# Patient Record
Sex: Female | Born: 1980 | Race: Black or African American | Hispanic: No | Marital: Single | Smoking: Never smoker
Health system: Southern US, Community
[De-identification: ages and names within clinical notes are randomized; demographics above are authoritative.]

## PROBLEM LIST (undated history)

## (undated) DIAGNOSIS — O24419 Gestational diabetes mellitus in pregnancy, unspecified control: Secondary | ICD-10-CM

## (undated) DIAGNOSIS — I2699 Other pulmonary embolism without acute cor pulmonale: Secondary | ICD-10-CM

## (undated) HISTORY — PX: ECTOPIC PREGNANCY SURGERY: SHX613

## (undated) HISTORY — PX: TUBAL LIGATION: SHX77

---

## 2011-08-06 ENCOUNTER — Emergency Department (HOSPITAL_BASED_OUTPATIENT_CLINIC_OR_DEPARTMENT_OTHER)
Admission: EM | Admit: 2011-08-06 | Discharge: 2011-08-06 | Disposition: A | Discharge status: Home / Self Care | Payer: Medicaid Other | Attending: Emergency Medicine | Admitting: Emergency Medicine

## 2011-08-06 ENCOUNTER — Encounter (HOSPITAL_BASED_OUTPATIENT_CLINIC_OR_DEPARTMENT_OTHER): Payer: Self-pay | Admitting: Emergency Medicine

## 2011-08-06 DIAGNOSIS — M549 Dorsalgia, unspecified: Secondary | ICD-10-CM | POA: Insufficient documentation

## 2011-08-06 DIAGNOSIS — R209 Unspecified disturbances of skin sensation: Secondary | ICD-10-CM | POA: Insufficient documentation

## 2011-08-06 HISTORY — DX: Other pulmonary embolism without acute cor pulmonale: I26.99

## 2011-08-06 LAB — URINALYSIS, ROUTINE W REFLEX MICROSCOPIC
Glucose, UA: NEGATIVE mg/dL
Ketones, ur: NEGATIVE mg/dL
Leukocytes, UA: NEGATIVE
Nitrite: NEGATIVE
Protein, ur: NEGATIVE mg/dL

## 2011-08-06 LAB — BASIC METABOLIC PANEL
BUN: 12 mg/dL (ref 6–23)
Chloride: 103 mEq/L (ref 96–112)
GFR calc Af Amer: >90 mL/min (ref >90)
GFR calc non Af Amer: >90 mL/min (ref >90)
Glucose, Bld: 96 mg/dL (ref 70–99)
Potassium: 4 mEq/L (ref 3.5–5.1)
Sodium: 136 mEq/L (ref 135–145)

## 2011-08-06 LAB — DIFFERENTIAL
Basophils Relative: 1 % (ref 0–1)
Eosinophils Absolute: 0.2 10*3/uL (ref 0.0–0.7)
Lymphs Abs: 2.1 10*3/uL (ref 0.7–4.0)
Monocytes Relative: 11 % (ref 3–12)
Neutro Abs: 2.5 10*3/uL (ref 1.7–7.7)
Neutrophils Relative %: 47 % (ref 43–77)

## 2011-08-06 LAB — CBC
Hemoglobin: 12.3 g/dL (ref 12.0–15.0)
MCH: 29.6 pg (ref 26.0–34.0)
Platelets: 231 10*3/uL (ref 150–400)
RBC: 4.16 MIL/uL (ref 3.87–5.11)

## 2011-08-06 LAB — PROTIME-INR: INR: 0.95 (ref 0.00–1.49)

## 2011-08-06 LAB — URINE MICROSCOPIC-ADD ON

## 2011-08-06 MED ORDER — TRAMADOL HCL 50 MG PO TABS
50.0000 mg | ORAL_TABLET | Freq: Once | ORAL | Status: AC
  Administered 2011-08-06: 50 mg via ORAL
  Filled 2011-08-06: qty 1

## 2011-08-06 MED ORDER — TRAMADOL HCL 50 MG PO TABS: 50.0000 mg | ORAL_TABLET | Freq: Four times a day (QID) | ORAL | Status: AC | PRN

## 2011-08-06 NOTE — ED Notes (Signed)
MD at bedside. 

## 2011-08-06 NOTE — ED Notes (Signed)
Pt had to be told repeatedly to stay off cell phone until triage could be completed.

## 2011-08-06 NOTE — ED Notes (Signed)
Pt reports numbness in hands, legs, feet, and all over x 1 month.

## 2011-08-06 NOTE — ED Notes (Signed)
Pt reports sudden onset of back pain between shoulder blades while standing at work. Pt denies bending over or lifting.

## 2011-08-06 NOTE — ED Notes (Signed)
Pt ambulatory to bathroom without difficulty.  

## 2011-08-06 NOTE — ED Notes (Signed)
Pt has tenderness all over back and neck, no point tenderness. Unable to fully assess ROM due to pt unwillingness to cooperate with essessment.

## 2011-08-06 NOTE — ED Notes (Signed)
Family at bedside. 

## 2011-08-06 NOTE — Discharge Instructions (Signed)
Back Pain, Adult Low back pain is very common. About 1 in 5 people have back pain.The cause of low back pain is rarely dangerous. The pain often gets better over time.About half of people with a sudden onset of back pain feel better in just 2 weeks. About 8 in 10 people feel better by 6 weeks.  CAUSES Some common causes of back pain include:  Strain of the muscles or ligaments supporting the spine.   Wear and tear (degeneration) of the spinal discs.   Arthritis.   Direct injury to the back.  DIAGNOSIS Most of the time, the direct cause of low back pain is not known.However, back pain can be treated effectively even when the exact cause of the pain is unknown.Answering your caregiver's questions about your overall health and symptoms is one of the most accurate ways to make sure the cause of your pain is not dangerous. If your caregiver needs more information, he or she may order lab work or imaging tests (X-rays or MRIs).However, even if imaging tests show changes in your back, this usually does not require surgery. HOME CARE INSTRUCTIONS For many people, back pain returns.Since low back pain is rarely dangerous, it is often a condition that people can learn to manageon their own.   Remain active. It is stressful on the back to sit or stand in one place. Do not sit, drive, or stand in one place for more than 30 minutes at a time. Take short walks on level surfaces as soon as pain allows.Try to increase the length of time you walk each day.   Do not stay in bed.Resting more than 1 or 2 days can delay your recovery.   Do not avoid exercise or work.Your body is made to move.It is not dangerous to be active, even though your back may hurt.Your back will likely heal faster if you return to being active before your pain is gone.   Pay attention to your body when you bend and lift. Many people have less discomfortwhen lifting if they bend their knees, keep the load close to their  bodies,and avoid twisting. Often, the most comfortable positions are those that put less stress on your recovering back.   Find a comfortable position to sleep. Use a firm mattress and lie on your side with your knees slightly bent. If you lie on your back, put a pillow under your knees.   Only take over-the-counter or prescription medicines as directed by your caregiver. Over-the-counter medicines to reduce pain and inflammation are often the most helpful.Your caregiver may prescribe muscle relaxant drugs.These medicines help dull your pain so you can more quickly return to your normal activities and healthy exercise.   Put ice on the injured area.   Put ice in a plastic bag.   Place a towel between your skin and the bag.   Leave the ice on for 15 to 20 minutes, 3 to 4 times a day for the first 2 to 3 days. After that, ice and heat may be alternated to reduce pain and spasms.   Ask your caregiver about trying back exercises and gentle massage. This may be of some benefit.   Avoid feeling anxious or stressed.Stress increases muscle tension and can worsen back pain.It is important to recognize when you are anxious or stressed and learn ways to manage it.Exercise is a great option.  SEEK MEDICAL CARE IF:  You have pain that is not relieved with rest or medicine.   You have   pain that does not improve in 1 week.   You have new symptoms.   You are generally not feeling well.  SEEK IMMEDIATE MEDICAL CARE IF:   You have pain that radiates from your back into your legs.   You develop new bowel or bladder control problems.   You have unusual weakness or numbness in your arms or legs.   You develop nausea or vomiting.   You develop abdominal pain.   You feel faint.  Document Released: 05/27/2005 Document Revised: 02/06/2011 Document Reviewed: 10/15/2010 [employer] Patient Information 2012 [employer].Back Exercises Back exercises help treat and prevent back injuries. The goal  of back exercises is to increase the strength of your abdominal and back muscles and the flexibility of your back. These exercises should be started when you no longer have back pain. Back exercises include:  Pelvic Tilt. Lie on your back with your knees bent. Tilt your pelvis until the lower part of your back is against the floor. Hold this position 5 to 10 sec and repeat 5 to 10 times.   Knee to Chest. Pull first 1 knee up against your chest and hold for 20 to 30 seconds, repeat this with the other knee, and then both knees. This may be done with the other leg straight or bent, whichever feels better.   Sit-Ups or Curl-Ups. Bend your knees 90 degrees. Start with tilting your pelvis, and do a partial, slow sit-up, lifting your trunk only 30 to 45 degrees off the floor. Take at least 2 to 3 seconds for each sit-up. Do not do sit-ups with your knees out straight. If partial sit-ups are difficult, simply do the above but with only tightening your abdominal muscles and holding it as directed.   Hip-Lift. Lie on your back with your knees flexed 90 degrees. Push down with your feet and shoulders as you raise your hips a couple inches off the floor; hold for 10 seconds, repeat 5 to 10 times.   Back arches. Lie on your stomach, propping yourself up on bent elbows. Slowly press on your hands, causing an arch in your low back. Repeat 3 to 5 times. Any initial stiffness and discomfort should lessen with repetition over time.   Shoulder-Lifts. Lie face down with arms beside your body. Keep hips and torso pressed to floor as you slowly lift your head and shoulders off the floor.  Do not overdo your exercises, especially in the beginning. Exercises may cause you some mild back discomfort which lasts for a few minutes; however, if the pain is more severe, or lasts for more than 15 minutes, do not continue exercises until you see your caregiver. Improvement with exercise therapy for back problems is slow.  See your  caregivers for assistance with developing a proper back exercise program. Document Released: 07/04/2004 Document Revised: 01/23/2011 Document Reviewed: 05/27/2005 [employer] Patient Information 2012 [employer]. 

## 2011-08-07 NOTE — ED Provider Notes (Signed)
History     CSN: [id number]  Arrival date & time 08/06/11  2021   First MD Initiated Contact with Patient 08/06/11 2058      Chief Complaint  Patient presents with  . Back Pain    (Consider location/radiation/quality/duration/timing/severity/associated sxs/prior treatment) HPI Patient is a 31 year old female who presents today with numerous complaints including mid thoracic back pain as well as numbness in her hands legs and feet for over one month. Patient reports that the pain in her upper back began today while she was at work. 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 There is no other significant injury reported. Patient describes the numbness she is experiencing as beginning in her feet and going all the way up her legs to her knees in a sock-like distribution. She also describes having numbness in her hands and forearms that extends from her fingers up to her elbows. This is bilateral. Patient denies any incontinence of stool or urine. Movement makes her pain worse. She's tried ibuprofen which has not made her pain better. There are no other associated or modifying factors. Past Medical History  Diagnosis Date  . PE (pulmonary embolism)     Past Surgical History  Procedure Date  . Tubal ligation     History reviewed. No pertinent family history.  History  Substance Use Topics  . Smoking status: Never Smoker   . Smokeless tobacco: Not on file  . Alcohol Use: Yes    OB History    Grav Para Term Preterm Abortions TAB SAB Ect Mult Living                  Review of Systems  Constitutional: Negative.   HENT: Negative.   Eyes: Negative.   Respiratory: Negative.   Cardiovascular: Negative.   Gastrointestinal: Negative.   Genitourinary: Negative.   Musculoskeletal: Positive for back pain.  Skin: Negative.   Neurological: Positive for numbness.  Hematological: Negative.   Psychiatric/Behavioral: Negative.   All other systems  reviewed and are negative.    Allergies  Review of patient's allergies indicates no known allergies.  Home Medications   Current Outpatient Rx  Name Route Sig Dispense Refill  . IBUPROFEN 200 MG PO TABS Oral Take 200 mg by mouth once as needed. For pain    . TRAMADOL HCL 50 MG PO TABS Oral Take 1 tablet (50 mg total) by mouth every 6 (six) hours as needed for pain. 30 tablet 0    BP 124/83  Pulse 83  Temp(Src) 98.2 F (36.8 C) (Oral)  Resp 18  SpO2 98%  LMP 08/06/2011  Physical Exam  Nursing note and vitals reviewed. GEN: Well-developed, well-nourished female in no distress HEENT: Atraumatic, normocephalic. Oropharynx clear without erythema EYES: PERRLA BL, no scleral icterus. NECK: Trachea midline, no meningismus CV: regular rate and rhythm. No murmurs, rubs, or gallops PULM: No respiratory distress.  No crackles, wheezes, or rales. GI: soft, non-tender. No guarding, rebound, or tenderness. + bowel sounds  Neuro: cranial nerves 2-12 intact, A and O x 3, patient reports numbness in a nonneurologic stocking and glove distribution. She reports weakness in her extremities but then is able to move them during the exam. MSK: Patient moves all 4 extremities symmetrically, no deformity, edema, or injury noted,  patient reports that she hurts every time I touch her   ED Course  Procedures (including critical care time)  Labs Reviewed  URINALYSIS, ROUTINE W REFLEX MICROSCOPIC - Abnormal; Notable  for the following:    Hgb urine dipstick MODERATE (*)    All other components within normal limits  URINE MICROSCOPIC-ADD ON - Abnormal; Notable for the following:    Squamous Epithelial / LPF FEW (*)    Bacteria, UA FEW (*)    All other components within normal limits  CBC  DIFFERENTIAL  BASIC METABOLIC PANEL  PROTIME-INR   No results found.   1. 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 Her exam was unremarkable. No imaging was warranted given the mechanism patient described. Additionally patient complained of numbness and weakness. She was able to ambulate independently and move all 4 extremities. Patient also had symptoms of a nonneurologic distribution. She was notified of this and reassured that she could not be having a stroke or any other concerning neurologic pathology today. Patient was given a dose of tramadol 50 mg by mouth here. She was discharged for similar. Patient also reported that she was concerned about having possible bruising and she did have CBC and coags there were negative today. Given her complain of back pain she had renal function checked there was also within normal limits as well as a urinalysis that was unremarkable. Though blood was noted on this patient was currently having her period and this was a non-cath specimen. Patient was discharged in good condition with a prescription for tramadol.        Cyndra Numbers, MD 08/07/11 [phone]

## 2012-09-01 ENCOUNTER — Emergency Department (HOSPITAL_BASED_OUTPATIENT_CLINIC_OR_DEPARTMENT_OTHER): Payer: Self-pay

## 2012-09-01 ENCOUNTER — Encounter (HOSPITAL_BASED_OUTPATIENT_CLINIC_OR_DEPARTMENT_OTHER): Payer: Self-pay | Admitting: *Deleted

## 2012-09-01 ENCOUNTER — Inpatient Hospital Stay (HOSPITAL_BASED_OUTPATIENT_CLINIC_OR_DEPARTMENT_OTHER)
Admission: EM | Admit: 2012-09-01 | Discharge: 2012-09-04 | DRG: 556 | Disposition: A | Discharge status: Home Health | Payer: PRIVATE HEALTH INSURANCE | Attending: Internal Medicine | Admitting: Internal Medicine

## 2012-09-01 DIAGNOSIS — R200 Anesthesia of skin: Secondary | ICD-10-CM

## 2012-09-01 DIAGNOSIS — R29898 Other symptoms and signs involving the musculoskeletal system: Principal | ICD-10-CM | POA: Diagnosis present

## 2012-09-01 DIAGNOSIS — R5383 Other fatigue: Secondary | ICD-10-CM

## 2012-09-01 DIAGNOSIS — R209 Unspecified disturbances of skin sensation: Secondary | ICD-10-CM | POA: Diagnosis present

## 2012-09-01 DIAGNOSIS — Z86711 Personal history of pulmonary embolism: Secondary | ICD-10-CM

## 2012-09-01 DIAGNOSIS — R531 Weakness: Secondary | ICD-10-CM

## 2012-09-01 DIAGNOSIS — F411 Generalized anxiety disorder: Secondary | ICD-10-CM | POA: Diagnosis present

## 2012-09-01 DIAGNOSIS — R202 Paresthesia of skin: Secondary | ICD-10-CM | POA: Diagnosis present

## 2012-09-01 LAB — COMPREHENSIVE METABOLIC PANEL
Albumin: 3.9 g/dL (ref 3.5–5.2)
BUN: 8 mg/dL (ref 6–23)
Calcium: 9.1 mg/dL (ref 8.4–10.5)
Chloride: 105 mEq/L (ref 96–112)
Creatinine, Ser: 0.7 mg/dL (ref 0.50–1.10)
Total Bilirubin: 0.2 mg/dL — ABNORMAL LOW (ref 0.3–1.2)

## 2012-09-01 LAB — CBC WITH DIFFERENTIAL/PLATELET
Basophils Absolute: 0 10*3/uL (ref 0.0–0.1)
Basophils Relative: 1 % (ref 0–1)
Eosinophils Absolute: 0.2 10*3/uL (ref 0.0–0.7)
Eosinophils Relative: 5 % (ref 0–5)
HCT: 35 % — ABNORMAL LOW (ref 36.0–46.0)
Hemoglobin: 11.9 g/dL — ABNORMAL LOW (ref 12.0–15.0)
MCH: 30.7 pg (ref 26.0–34.0)
MCHC: 34 g/dL (ref 30.0–36.0)
MCV: 90.2 fL (ref 78.0–100.0)
Monocytes Absolute: 0.5 10*3/uL (ref 0.1–1.0)
Monocytes Relative: 12 % (ref 3–12)
Neutro Abs: 1.6 10*3/uL — ABNORMAL LOW (ref 1.7–7.7)
RDW: 12.1 % (ref 11.5–15.5)

## 2012-09-01 LAB — PREGNANCY, URINE: Preg Test, Ur: NEGATIVE

## 2012-09-01 MED ORDER — ONDANSETRON HCL 4 MG/2ML IJ SOLN: 4.0000 mg | Freq: Four times a day (QID) | INTRAMUSCULAR | Status: DC | PRN

## 2012-09-01 MED ORDER — HYDROCODONE-ACETAMINOPHEN 5-325 MG PO TABS
1.0000 | ORAL_TABLET | ORAL | Status: DC | PRN
  Administered 2012-09-01 – 2012-09-03 (×3): 2 via ORAL
  Filled 2012-09-01 (×2): qty 2

## 2012-09-01 MED ORDER — ONDANSETRON HCL 4 MG PO TABS: 4.0000 mg | ORAL_TABLET | Freq: Four times a day (QID) | ORAL | Status: DC | PRN

## 2012-09-01 MED ORDER — SODIUM CHLORIDE 0.9 % IV SOLN: 250.0000 mL | INTRAVENOUS | Status: DC | PRN

## 2012-09-01 MED ORDER — SODIUM CHLORIDE 0.9 % IJ SOLN
3.0000 mL | Freq: Two times a day (BID) | INTRAMUSCULAR | Status: DC
  Administered 2012-09-02: 3 mL via INTRAVENOUS

## 2012-09-01 MED ORDER — SODIUM CHLORIDE 0.9 % IJ SOLN
3.0000 mL | Freq: Two times a day (BID) | INTRAMUSCULAR | Status: DC
  Administered 2012-09-02 – 2012-09-04 (×6): 3 mL via INTRAVENOUS

## 2012-09-01 MED ORDER — SODIUM CHLORIDE 0.9 % IJ SOLN: 3.0000 mL | INTRAMUSCULAR | Status: DC | PRN

## 2012-09-01 MED ORDER — ENOXAPARIN SODIUM 40 MG/0.4ML ~~LOC~~ SOLN
40.0000 mg | SUBCUTANEOUS | Status: DC
  Administered 2012-09-01 – 2012-09-03 (×3): 40 mg via SUBCUTANEOUS
  Filled 2012-09-01 (×4): qty 0.4

## 2012-09-01 NOTE — ED Notes (Signed)
[facility] here for transport.  

## 2012-09-01 NOTE — Consult Note (Signed)
NEURO HOSPITALIST CONSULT NOTE    Reason for Consult: Intermittent weakness and numbness of left upper extremity and both lower extremities.  HPI:                                                                                                                                          Joy Wright is an 32 y.o. female history of pulmonary embolism, presenting with complaint of intermittent weakness of both lower extremities and left upper extremity along with numbness involving all 4 extremities over the past several months. She had a similar spell of symptoms between January and March of 2013. Numbness and tingling typically starts in the hands and feet. Symptoms progressed and weakness involving left upper extremity and left lower extremity primarily and to a lesser extent right lower extremity. There is no clear history of urinary urgency. She has not had urinary incontinence. She's had no symptoms involving speech and no facial weakness or numbness. She'll CT scan of her head today without contrast which was normal. She's had no injury to her neck.  Past Medical History  Diagnosis Date  . PE (pulmonary embolism)     Past Surgical History  Procedure Laterality Date  . Tubal ligation      History reviewed. No pertinent family history.   Social History:  reports that she has never smoked. She does not have any smokeless tobacco history on file. She reports that  drinks alcohol. She reports that she does not use illicit drugs.  No Known Allergies  MEDICATIONS:                                                                                                                     I have reviewed the patient's current medications. Prior to Admission:  Prescriptions prior to admission  Medication Sig Dispense Refill  . ibuprofen (ADVIL,MOTRIN) 200 MG tablet Take 200 mg by mouth once as needed. For pain         Blood pressure 134/83, pulse 67, temperature 98 F (36.7  C), temperature source Oral, resp. rate 16, height 5\' 2"  (1.575 m), weight 57.5 kg (126 lb 12.2 oz), last menstrual period 08/23/2012, SpO2 100.00%.   Neurologic Examination:  Mental Status: Alert, oriented, thought content appropriate.  Speech fluent without evidence of aphasia. Able to follow commands without difficulty. Cranial Nerves: II-Visual fields were normal. III/IV/VI-Pupils were equal and reacted. Extraocular movements were full and conjugate.    V/VII-slight reduction in tactile sensation over the left side of face compared to the right; no facial weakness. VIII-normal. X-normal speech and symmetrical palatal movement. Motor: Equivocal mild weakness proximally and distally of left upper and lower extremities. Effort was variable. Strength of right extremities was normal. Muscle tone was normal throughout. Sensory: Reduced perception of tactile sensation over left extremities compared to right extremities Deep Tendon Reflexes: Asymmetric with 2+ left lower extremity reflexes compared to 1+ on the right; 1+ and symmetrical extremities on the left.. Plantars: Flexor bilaterally Cerebellar: Normal finger-to-nose testing on the right; poor effort with testing of left upper extremity. Carotid auscultation: Normal  No results found for this basename: cbc, bmp, coags, chol, tri, ldl, hga1c    Results for orders placed during the hospital encounter of 09/01/12 (from the past 48 hour(s))  CBC WITH DIFFERENTIAL     Status: Abnormal   Collection Time    09/01/12  3:50 PM      Result Value Range   WBC 4.3  4.0 - 10.5 K/uL   RBC 3.88  3.87 - 5.11 MIL/uL   Hemoglobin 11.9 (*) 12.0 - 15.0 g/dL   HCT 16.1 (*) 09.6 - 04.5 %   MCV 90.2  78.0 - 100.0 fL   MCH 30.7  26.0 - 34.0 pg   MCHC 34.0  30.0 - 36.0 g/dL   RDW 40.9  81.1 - 91.4 %   Platelets 188  150 - 400 K/uL   Neutrophils Relative  37 (*) 43 - 77 %   Neutro Abs 1.6 (*) 1.7 - 7.7 K/uL   Lymphocytes Relative 45  12 - 46 %   Lymphs Abs 2.0  0.7 - 4.0 K/uL   Monocytes Relative 12  3 - 12 %   Monocytes Absolute 0.5  0.1 - 1.0 K/uL   Eosinophils Relative 5  0 - 5 %   Eosinophils Absolute 0.2  0.0 - 0.7 K/uL   Basophils Relative 1  0 - 1 %   Basophils Absolute 0.0  0.0 - 0.1 K/uL  COMPREHENSIVE METABOLIC PANEL     Status: Abnormal   Collection Time    09/01/12  3:50 PM      Result Value Range   Sodium 137  135 - 145 mEq/L   Potassium 4.0  3.5 - 5.1 mEq/L   Chloride 105  96 - 112 mEq/L   CO2 24  19 - 32 mEq/L   Glucose, Bld 91  70 - 99 mg/dL   BUN 8  6 - 23 mg/dL   Creatinine, Ser 7.82  0.50 - 1.10 mg/dL   Calcium 9.1  8.4 - 95.6 mg/dL   Total Protein 7.5  6.0 - 8.3 g/dL   Albumin 3.9  3.5 - 5.2 g/dL   AST 15  0 - 37 U/L   ALT 9  0 - 35 U/L   Alkaline Phosphatase 43  39 - 117 U/L   Total Bilirubin 0.2 (*) 0.3 - 1.2 mg/dL   GFR calc non Af Amer >90  >90 mL/min   GFR calc Af Amer >90  >90 mL/min   Comment:            The eGFR has been calculated     using the CKD EPI  equation.     This calculation has not been     validated in all clinical     situations.     eGFR's persistently     <90 mL/min signify     possible Chronic Kidney Disease.  PREGNANCY, URINE     Status: None   Collection Time    09/01/12  4:09 PM      Result Value Range   Preg Test, Ur NEGATIVE  NEGATIVE   Comment:            THE SENSITIVITY OF THIS     METHODOLOGY IS >20 mIU/mL.    Ct Head Wo Contrast  09/01/2012  *RADIOLOGY REPORT*  Clinical Data: Chronic left-sided weakness  CT HEAD WITHOUT CONTRAST  Technique:  Contiguous axial images were obtained from the base of the skull through the vertex without contrast.  Comparison: None.  Findings: No evidence of parenchymal hemorrhage or extra-axial fluid collection. No mass lesion, mass effect, or midline shift.  No CT evidence of acute infarction.  Cerebral volume is age appropriate.  No  ventriculomegaly.  The visualized paranasal sinuses are essentially clear. The mastoid air cells are unopacified.  No evidence of calvarial fracture.  IMPRESSION: Normal head CT.   Original Report Authenticated By: Charline Bills, M.D.    Assessment/Plan: Etiology for presenting motor and sensory symptoms is unclear. Patient has no clear indications clinically of encephalopathic or myelopathic process. Demyelinating disease is a consideration cannot be ruled out, however. It appeared to be significant psychophysiologic contributions to her symptomatology.  Recommendations: 1. MRI of the brain without and with contrast to rule out possible demyelinating disorder. 2. MRI of cervical spine without and with contrast as well to rule out possible demyelinating lesion as well as possible cord compression. 3. Physical therapy consult. 4. Neurology to continue following this patient with you.  Venetia Maxon M.D. [facility] [phone]  09/01/2012, 8:10 PM

## 2012-09-01 NOTE — ED Provider Notes (Addendum)
History     CSN: [id number]  Arrival date & time 09/01/12  1506   First MD Initiated Contact with Patient 09/01/12 1519      Chief Complaint  Patient presents with  . Leg Pain  . Arm Pain  . Headache    (Consider location/radiation/quality/duration/timing/severity/associated sxs/prior treatment) Patient is a 32 y.o. female presenting with leg pain, arm pain, and headaches. The history is provided by the patient.  Leg Pain Arm Pain Associated symptoms include headaches.  Headache pt c/o intermittent numbness and weakness x 2 months worse x 2 week--sx localized to bilateral le and right arm--nothing makes the sx better or worse, no associated headache or visual changes--no fever or neck pain--no medications taken--pt also notes pain with these sx and was seen in ED for same and rx ultram  Past Medical History  Diagnosis Date  . PE (pulmonary embolism)     Past Surgical History  Procedure Laterality Date  . Tubal ligation      History reviewed. No pertinent family history.  History  Substance Use Topics  . Smoking status: Never Smoker   . Smokeless tobacco: Not on file  . Alcohol Use: Yes    OB History   Grav Para Term Preterm Abortions TAB SAB Ect Mult Living                  Review of Systems  Neurological: Positive for headaches.  All other systems reviewed and are negative.    Allergies  Review of patient's allergies indicates no known allergies.  Home Medications   Current Outpatient Rx  Name  Route  Sig  Dispense  Refill  . ibuprofen (ADVIL,MOTRIN) 200 MG tablet   Oral   Take 200 mg by mouth once as needed. For pain           BP 127/88  Pulse 74  Temp(Src) 98.6 F (37 C) (Oral)  Resp 20  Ht 5\' 2"  (1.575 m)  Wt 140 lb (63.504 kg)  BMI 25.6 kg/m2  SpO2 100%  LMP 08/23/2012  Physical Exam  Nursing note and vitals reviewed. Constitutional: She is oriented to person, place, and time. She appears well-developed and well-nourished.   Non-toxic appearance. No distress.  HENT:  Head: Normocephalic and atraumatic.  Eyes: Conjunctivae, EOM and lids are normal. Pupils are equal, round, and reactive to light.  Neck: Normal range of motion. Neck supple. No tracheal deviation present. No mass present.  Cardiovascular: Normal rate, regular rhythm and normal heart sounds.  Exam reveals no gallop.   No murmur heard. Pulmonary/Chest: Effort normal and breath sounds normal. No stridor. No respiratory distress. She has no decreased breath sounds. She has no wheezes. She has no rhonchi. She has no rales.  Abdominal: Soft. Normal appearance and bowel sounds are normal. She exhibits no distension. There is no tenderness. There is no rebound and no CVA tenderness.  Musculoskeletal: Normal range of motion. She exhibits no edema and no tenderness.  Neurological: She is alert and oriented to person, place, and time. She displays abnormal reflex. She displays no atrophy and no tremor. A sensory deficit is present. No cranial nerve deficit. GCS eye subscore is 4. GCS verbal subscore is 5. GCS motor subscore is 6.  Reflex Scores:      Patellar reflexes are 1+ on the left side. Left upper and left lower strength ext strength 3/5, no facial droop--subjective numbness bilateral le  Skin: Skin is warm and dry. No abrasion and no rash noted.  Psychiatric: Her speech is normal and behavior is normal. Her affect is blunt.    ED Course  Procedures (including critical care time)  Labs Reviewed  CBC WITH DIFFERENTIAL  COMPREHENSIVE METABOLIC PANEL  PREGNANCY, URINE   No results found.   No diagnosis found.    MDM  Spoke with neurology about patient's current symptoms. Patient will need to have an MRI of her brain and C-spine as well as for the work. They recommended the patient be transferred to [facility] and admitted to the hospitalist service for consultation neurology. He'll contact tried to arrange this. Patient symptoms did not  meet the code stroke criteria because she they have been waxing and waning for over 2 weeks and the patient states that she is having lots of pain which is inhibiting her from moving her left arm and left leg normally. She does endorse numbness but states that the pain is worse preventing her from cooperating with my exam   Date: 09/01/2012  Rate: 76  Rhythm: normal sinus rhythm  QRS Axis: normal  Intervals: normal  ST/T Wave abnormalities: normal  Conduction Disutrbances:none  Narrative Interpretation:   Old EKG Reviewed: none available        Toy Baker, MD 09/01/12 1723  Toy Baker, MD 09/01/12 [phone]

## 2012-09-01 NOTE — Progress Notes (Signed)
32 year old female-complaining of pain/numbness/weakness the left side of the body. CT head negative. ED M.D. consulted with neurology who asked [place] to admit and they will consult. They wanted a MRI of the brain and MRI of the C-spine. He is formally consult neurology when the patient gets to [facility].

## 2012-09-01 NOTE — ED Notes (Signed)
Called neurologoist for Dr. Freida Busman via [facility]

## 2012-09-01 NOTE — ED Notes (Signed)
MD at bedside. 

## 2012-09-01 NOTE — ED Notes (Signed)
Patient transported to CT 

## 2012-09-01 NOTE — ED Notes (Signed)
Pt to triage in w/c reporting left arm and leg pains and ha off and on since January, worse today while at work so she called ems.

## 2012-09-01 NOTE — H&P (Signed)
History and Physical  Joy Wright ZOX:[id number] DOB: [birth year] DOA: 09/01/2012  Referring physician: Lorre Nick MD PCP: No primary provider on file.   Chief Complaint: left arm weakness and numbness  HPI: Patient is a 32 year old female with past medical history of pulmonary embolism presented to [facility] with chief complaints of inability to walk due to left sided weakness and paralysis. Patient has been having episodes of numbness and weakness in her extremities since January 2013. She had a similar spell of symptoms between January and March of 2013. Numbness and tingling typically starts in the hands and feet. Symptoms progressed and weakness involving left upper extremity and left lower extremity primarily and to a lesser extent right lower extremity. There is no clear history of urinary urgency. She has not had urinary incontinence. She's had no symptoms involving speech and no facial weakness or numbness. Patient denies any fever, chills or any recent viral illnesses. No changes in bowel movements have been noted. Patient has not had any recent changes in her medications. CT scan of her head today without contrast which was normal. She's had no injury to her neck.  Patient was admitted to [facility] to get further evaluation by neurology.   Review of Systems:  15 point review of systems is negative otherwise.  Past Medical History  Diagnosis Date  . PE (pulmonary embolism)     Past Surgical History  Procedure Laterality Date  . Tubal ligation      Social History:  reports that she has never smoked. She does not have any smokeless tobacco history on file. She reports that  drinks alcohol. She reports that she does not use illicit drugs.  No Known Allergies  History reviewed. No pertinent family history.   Prior to Admission medications   Medication Sig Start Date End Date Taking? Authorizing Provider  ibuprofen (ADVIL,MOTRIN) 200 MG tablet Take 200  mg by mouth once as needed. For pain    Historical Provider, MD   Physical Exam: Filed Vitals:   09/01/12 1520 09/01/12 1659 09/01/12 1809 09/01/12 1916  BP: 127/88 125/91 121/78 134/83  Pulse: 74 72 69 67  Temp: 98.6 F (37 C)   98 F (36.7 C)  TempSrc: Oral   Oral  Resp: 20 16 20 16   Height: 5\' 2"  (1.575 m)   5\' 2"  (1.575 m)  Weight: 140 lb (63.504 kg)   126 lb 12.2 oz (57.5 kg)  SpO2: 100% 100% 100% 100%   Physical Exam: General: Vital signs reviewed and noted. Well-developed, well-nourished, in no acute distress; alert, appropriate and cooperative throughout examination.  Head: Normocephalic, atraumatic.  Eyes: PERRL, EOMI, No signs of anemia or jaundince.  Nose: Mucous membranes moist, not inflammed, nonerythematous.  Throat: Oropharynx nonerythematous, no exudate appreciated.   Neck: No deformities, masses, or tenderness noted.Supple, No carotid Bruits, no JVD.  Lungs:  Normal respiratory effort. Clear to auscultation BL without crackles or wheezes.  Heart: RRR. S1 and S2 normal without gallop, murmur, or rubs.  Abdomen:  BS normoactive. Soft, Nondistended, non-tender.  No masses or organomegaly.  Extremities: No pretibial edema.  Neurologic:  patient is alert and oriented x3, left upper extremity and left lower extremity strength is 0/5, right upper extremity and right lower extremity is 5 out of 5, cranial nerves 2-12 intact, sensations are absent the left upper and lower extremities but normal in the right upper and lower extremities, Reflexes are asymmetrical and 1+ in the right upper and lower extremity  but absent on the left upper and left lower extremity  Gait could not be assessed  Unable to perform cerebellar testing on the left side   Skin: No visible rashes, scars.     Wt Readings from Last 3 Encounters:  09/01/12 126 lb 12.2 oz (57.5 kg)    Labs on Admission:  Basic Metabolic Panel:  Recent Labs Lab 09/01/12 1550  NA 137  K 4.0  CL 105  CO2 24   GLUCOSE 91  BUN 8  CREATININE 0.70  CALCIUM 9.1    Liver Function Tests:  Recent Labs Lab 09/01/12 1550  AST 15  ALT 9  ALKPHOS 43  BILITOT 0.2*  PROT 7.5  ALBUMIN 3.9   CBC:  Recent Labs Lab 09/01/12 1550  WBC 4.3  NEUTROABS 1.6*  HGB 11.9*  HCT 35.0*  MCV 90.2  PLT 188     Radiological Exams on Admission: Ct Head Wo Contrast  09/01/2012  *RADIOLOGY REPORT*  Clinical Data: Chronic left-sided weakness  CT HEAD WITHOUT CONTRAST  Technique:  Contiguous axial images were obtained from the base of the skull through the vertex without contrast.  Comparison: None.  Findings: No evidence of parenchymal hemorrhage or extra-axial fluid collection. No mass lesion, mass effect, or midline shift.  No CT evidence of acute infarction.  Cerebral volume is age appropriate.  No ventriculomegaly.  The visualized paranasal sinuses are essentially clear. The mastoid air cells are unopacified.  No evidence of calvarial fracture.  IMPRESSION: Normal head CT.   Original Report Authenticated By: Charline Bills, M.D.      Principal Problem:   Left-sided weakness   Assessment/Plan  32 year old female with past medical history of pulmonary embolism currently not on any treatment comes in today with left sided weakness and inability to walk. Most likely differential at this time include demyelinating disorder like multiple myeloma given her age group. Neurology has already been consulted and they're recommending MRI of her head and spine. Patient's physical exam and history are not very consistent and it appears that patient is not participating in the exam to the max of her inability almost 12 point that it seems that she is malingering.  -Obtain MRI of brain and cervical spine -Consult physical therapy -Consult psychiatry if MRI is negative   Code Status: Full code Family Communication: None present at bedside Disposition Plan/Anticipated LOS: Guarded  Time spent: 75 minutes  Lars Mage, MD  [facility] Team 5  If 7PM-7AM, please contact night-coverage at [email or web address], password [facility] 09/01/2012, 7:24 PM

## 2012-09-02 ENCOUNTER — Inpatient Hospital Stay (HOSPITAL_COMMUNITY): Payer: Self-pay

## 2012-09-02 DIAGNOSIS — R29898 Other symptoms and signs involving the musculoskeletal system: Principal | ICD-10-CM

## 2012-09-02 MED ORDER — LORAZEPAM 2 MG/ML IJ SOLN
0.5000 mg | INTRAMUSCULAR | Status: DC | PRN
Start: 2012-09-02 — End: 2012-09-04
  Administered 2012-09-02: 0.5 mg via INTRAVENOUS

## 2012-09-02 MED ORDER — GADOBENATE DIMEGLUMINE 529 MG/ML IV SOLN
12.0000 mL | Freq: Once | INTRAVENOUS | Status: AC | PRN
  Administered 2012-09-02: 12 mL via INTRAVENOUS

## 2012-09-02 MED ORDER — LORAZEPAM 2 MG/ML IJ SOLN
INTRAMUSCULAR | Status: AC
  Filled 2012-09-02: qty 1

## 2012-09-02 MED ORDER — GADOBENATE DIMEGLUMINE 529 MG/ML IV SOLN: 12.0000 mL | Freq: Once | INTRAVENOUS | Status: DC | PRN

## 2012-09-02 NOTE — Progress Notes (Addendum)
NEURO HOSPITALIST PROGRESS NOTE   SUBJECTIVE:                                                                                                                        Still C/O weakness in left UE and LE but no paresthesia at this time.   OBJECTIVE:                                                                                                                           Vital signs in last 24 hours: Temp:  [97.7 F (36.5 C)-98.6 F (37 C)] 97.7 F (36.5 C) (03/26 0532) Pulse Rate:  [67-74] 68 (03/26 0532) Resp:  [16-20] 18 (03/26 0532) BP: (103-134)/(49-91) 103/49 mmHg (03/26 0532) SpO2:  [100 %] 100 % (03/26 0532) Weight:  [57.5 kg (126 lb 12.2 oz)-63.504 kg (140 lb)] 57.5 kg (126 lb 12.2 oz) (03/25 1916)  Intake/Output from previous day: 03/25 0701 - 03/26 0700 In: -  Out: 1 [Urine:1] Intake/Output this shift:   Nutritional status: General  Past Medical History  Diagnosis Date  . PE (pulmonary embolism)      Neurologic Exam:  Mental Status: Alert, oriented, thought content appropriate.  Speech fluent without evidence of aphasia.  Able to follow 3 step commands without difficulty. Cranial Nerves: II: Discs flat bilaterally; Visual fields grossly normal, pupils equal, round, reactive to light and accommodation III,IV, VI: ptosis not present, extra-ocular motions intact bilaterally V,VII: smile symmetric, facial light touch sensation normal bilaterally VIII: hearing normal bilaterally IX,X: gag reflex present XI: bilateral shoulder shrug XII: midline tongue extension Motor: Right : Upper extremity   5/5    Left:     Upper extremity   note  Lower extremity   5/5     Lower extremity   Note --patient showed good grip with left hand and significant give way strength with left bicep and triceps resistance.  --patient showed no effort in moving left leg and positive Hoover's.  Tone and bulk:normal tone throughout; no atrophy  noted Sensory: Pinprick and light touch intact throughout, bilaterally Deep Tendon Reflexes: 2+ and symmetric throughout Plantars: Right: downgoing   Left: downgoing Cerebellar: normal finger-to-nose on right,  normal heel-to-shin test on left CV: pulses palpable  throughout    Lab Results: No results found for this basename: cbc, bmp, coags, chol, tri, ldl, hga1c   Lipid Panel No results found for this basename: CHOL, TRIG, HDL, CHOLHDL, VLDL, LDLCALC,  in the last 72 hours  Studies/Results: Ct Head Wo Contrast  09/01/2012  *RADIOLOGY REPORT*  Clinical Data: Chronic left-sided weakness  CT HEAD WITHOUT CONTRAST  Technique:  Contiguous axial images were obtained from the base of the skull through the vertex without contrast.  Comparison: None.  Findings: No evidence of parenchymal hemorrhage or extra-axial fluid collection. No mass lesion, mass effect, or midline shift.  No CT evidence of acute infarction.  Cerebral volume is age appropriate.  No ventriculomegaly.  The visualized paranasal sinuses are essentially clear. The mastoid air cells are unopacified.  No evidence of calvarial fracture.  IMPRESSION: Normal head CT.   Original Report Authenticated By: Charline Bills, M.D.      Mr Cervical Spine Wo Contrast  09/02/2012  *RADIOLOGY REPORT*  Clinical Data:  32 year old female with history of pulmonary embolism.  Left side weakness, paralysis.  Episodes of numbness and weakness since January.  No known injury.  The patient declined the postcontrast portion of the exams as had been initially planned.  MRI HEAD WITHOUT CONTRAST MRI CERVICAL SPINE WITHOUT CONTRAST  Technique:  Multiplanar, multiecho pulse sequences of the brain and surrounding structures, and cervical spine, to include the craniocervical junction and cervicothoracic junction, were obtained without intravenous contrast.  Comparison:  Head CT without contrast 09/01/2012.  MRI HEAD  Findings:  Moderate wrap artifact affecting  diffusion weighted imaging.  Artifact in the medial right thalamus.  No definite No restricted diffusion to suggest acute infarction.  Normal cerebral volume. Major intracranial vascular flow voids are preserved. No midline shift, ventriculomegaly, mass effect, evidence of mass lesion, extra-axial collection or acute intracranial hemorrhage.  Cervicomedullary junction and pituitary are within normal limits.  Wallace Cullens and white matter signal is within normal limits throughout the brain.  Normal bone marrow signal.  Visualized orbit soft tissues are within normal limits.  Minor paranasal sinus mucosal thickening.  Small nasopharyngeal retention cyst.  Mastoids are clear.  Grossly normal visualized internal auditory structures.  Negative scalp soft tissues.  IMPRESSION: 1. Normal noncontrast MRI appearance of the brain. 2.  Cervical spine findings are below.  MRI CERVICAL SPINE  Findings: Straightening of cervical lordosis. No marrow edema or evidence of acute osseous abnormality.  4-5 mm T2 hyperintense right thyroid nodule is too small to characterize and most likely benign in the absence of known risk factors for thyroid carcinoma.  Other visible thyroid parenchyma is within normal limits.  Other visualized paraspinal soft tissues are within normal limits.  C2-C3:  Negative.  C3-C4:  Negative.  C4-C5:  Negative.  C5-C6:  Negative.  C6-C7:  Negative.  C7-T1:  Negative. Negative visualized upper thoracic levels.  Spinal cord signal is within normal limits at all visualized levels.  IMPRESSION: Normal MRI appearance of the cervical spine.   Original Report Authenticated By: Erskine Speed, M.D.     MEDICATIONS  Scheduled: . enoxaparin (LOVENOX) injection  40 mg Subcutaneous Q24H  . sodium chloride  3 mL Intravenous Q12H  . sodium chloride  3 mL Intravenous Q12H    ASSESSMENT/PLAN:                                                                                                              Etiology still unclear as MRI brain and C-spine (noncontrast) are normal but patient will need MRI brain and C-spine with contrast to fully R/O MS. Exam does show effort dependent strength on left UE and positive Hoover's on LE. Patient admits to being under significant stress at home. It appeared to be significant psychophysiologic contributions to her symptomatology. PT will be seeing patient today.   Plan : 1) MRI brain and C-spine with contrast 2) PT/OT       Assessment and plan discussed with with attending physician and they are in agreement.    Felicie Morn PA-C [facility] [phone]  09/02/2012, 1:15 PM   Addendum: MRI brain and cervical spine with and without contrast are unremarkable. No evidence of structural brain or spinal cord involvement on neuro-exam, which is mainly a functional exam. We will sign off.  Wyatt Portela, MD [facility].

## 2012-09-02 NOTE — Progress Notes (Signed)
Physical Therapy Evaluation Patient Details Name: Joy Wright MRN: [id number] DOB: [birth year] Today's Date: 09/02/2012 Time: 4098-1191 PT Time Calculation (min): 27 min  PT Assessment / Plan / Recommendation Clinical Impression  Pt is 32 yo female with left > right weakness. Though her left side is profoundly weak, her right side is not full strength either, grossly 3+/5 throughout. Pt experiencing pain at the left hip as well as the distal left leg and wround the patella. Pt was very motivated throughout eval to participate and strongly desires to get home to her children. However, at this time, she needs +2 asisst to transfer bed to chair and was unable to ambulate. Left knee buckles in standing. Highly recommend CIR consult. PT will follow to increase strength and mobilty to increase independence and safety.    PT Assessment  Patient needs continued PT services    Follow Up Recommendations  CIR;Supervision/Assistance - 24 hour    Does the patient have the potential to tolerate intense rehabilitation      Barriers to Discharge Decreased caregiver support pt has good family support but is currently needing +2 assist    Equipment Recommendations  Other (comment) (TBD)    Recommendations for Other Services Rehab consult;OT consult   Frequency Min 4X/week    Precautions / Restrictions Precautions Precautions: Fall Restrictions Weight Bearing Restrictions: No   Pertinent Vitals/Pain VSS      Mobility  Bed Mobility Bed Mobility: Rolling Left;Left Sidelying to Sit;Sitting - Scoot to Edge of Bed Rolling Left: 3: Mod assist Left Sidelying to Sit: 3: Mod assist Sitting - Scoot to Edge of Bed: 4: Min assist Details for Bed Mobility Assistance: pt able to use right arm to pull self to rail, left LE dependent for off bed, pt with pain in left hip with left SL Transfers Transfers: Sit to Stand;Stand to Sit;Stand Pivot Transfers Sit to Stand: 1: +2 Total assist;From bed;From  chair/3-in-1;With upper extremity assist Sit to Stand: Patient Percentage: 50% Stand to Sit: 1: +2 Total assist;To chair/3-in-1;With upper extremity assist Stand to Sit: Patient Percentage: 60% Stand Pivot Transfers: 1: +2 Total assist Stand Pivot Transfers: Patient Percentage: 60% Details for Transfer Assistance: left foot and knee blocked during transfer, left knee buckling slightly and difficulty stepping right foot due to inablilty to wt shift to the left.  Ambulation/Gait Ambulation/Gait Assistance: Not tested (comment) Stairs: No Wheelchair Mobility Wheelchair Mobility: No Modified Rankin (Stroke Patients Only) Pre-Morbid Rankin Score: No significant disability Modified Rankin: Severe disability    Exercises     PT Diagnosis: Difficulty walking;Hemiplegia non-dominant side;Acute pain;Generalized weakness  PT Problem List: Decreased strength;Decreased range of motion;Decreased activity tolerance;Decreased balance;Decreased mobility;Decreased coordination;Decreased knowledge of use of DME;Decreased knowledge of precautions;Pain PT Treatment Interventions: DME instruction;Gait training;Functional mobility training;Therapeutic activities;Therapeutic exercise;Balance training;Neuromuscular re-education;Patient/family education   PT Goals Acute Rehab PT Goals PT Goal Formulation: With patient Time For Goal Achievement: 09/16/12 Potential to Achieve Goals: Good Pt will go Supine/Side to Sit: with min assist PT Goal: Supine/Side to Sit - Progress: Goal set today Pt will go Sit to Supine/Side: with min assist PT Goal: Sit to Supine/Side - Progress: Goal set today Pt will go Sit to Stand: with min assist PT Goal: Sit to Stand - Progress: Goal set today Pt will go Stand to Sit: with min assist PT Goal: Stand to Sit - Progress: Goal set today Pt will Transfer Bed to Chair/Chair to Bed: with min assist PT Transfer Goal: Bed to Chair/Chair to Bed - Progress: Goal set  today Pt will  Ambulate: with +2 total assist;with least restrictive assistive device;1 - 15 feet;Other (comment) (pt >60%) PT Goal: Ambulate - Progress: Goal set today  Visit Information  Last PT Received On: 09/02/12 Assistance Needed: +2    Subjective Data  Subjective: I have to get back home to my kids, I cried all night last night being away from them Patient Stated Goal: return to home and work   Prior Functioning  Home Living Lives With: Family Available Help at Discharge: Family;Available 24 hours/day Type of Home: House Home Access: Stairs to enter [employer] of Steps: 4 Entrance Stairs-Rails: Right Home Layout: One level Home Adaptive Equipment: None Additional Comments: pt seems to have good family support, states that her mother and brother and sister can help her Prior Function Level of Independence: Independent Able to Take Stairs?: Yes Driving: Yes Vocation: Full time employment Comments: 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, paperwork, etc Communication Communication: No difficulties    Copywriter, advertising Overall Cognitive Status: Appears within functional limits for tasks assessed/performed Arousal/Alertness: Awake/alert Orientation Level: Oriented X4 / Intact Behavior During Session: WFL for tasks performed Cognition - Other Comments: seems appropriately concerned about her current situation and has many questions about how she will get better and return to her normal function    Extremity/Trunk Assessment Right Upper Extremity Assessment RUE ROM/Strength/Tone: Deficits RUE ROM/Strength/Tone Deficits: grossly 4-/5, much better than the left but not WFL strength for her RUE Sensation: WFL - Light Touch RUE Coordination: WFL - gross motor;WFL - gross/fine motor Left Upper Extremity Assessment LUE ROM/Strength/Tone: Deficits LUE ROM/Strength/Tone Deficits: 3/5 tricep activation and shoulder depression, minimal use of the left hand and  pt reports that it feels swollen and heavy. It has been weakner than the right hand for the past year but she was still able to use it in her job and daily life, significantly worse now LUE Sensation: Deficits LUE Sensation Deficits: numbness to lt touch LUE Coordination: Deficits Right Lower Extremity Assessment RLE ROM/Strength/Tone: Deficits RLE ROM/Strength/Tone Deficits: much stronger than the left but still with the following deficits: knee ext 3+/5, knee flex 3+/5, hip flex 3+/5 RLE Sensation: WFL - Light Touch;WFL - Proprioception RLE Coordination: WFL - gross motor Left Lower Extremity Assessment LLE ROM/Strength/Tone: Deficits LLE ROM/Strength/Tone Deficits: no df/ pf noted at ankle but was able to pf 5th toe, quad 1+/5, hip flex 1/5, pt notes a feeling of heaviness of the whole left side LLE Sensation: Deficits LLE Sensation Deficits: numbness LLE  LLE Coordination: Deficits LLE Coordination Deficits: unable to manage mvmt of LLE Trunk Assessment Trunk Assessment: Normal   Balance Balance Balance Assessed: Yes Static Sitting Balance Static Sitting - Balance Support: Feet supported;Bilateral upper extremity supported Static Sitting - Level of Assistance: 5: Stand by assistance  End of Session PT - End of Session Equipment Utilized During Treatment: Gait belt Activity Tolerance: Patient limited by fatigue;Patient limited by pain Patient left: in bed;with call bell/phone within reach Nurse Communication: Mobility status  GP   Lyanne Co, PT  Acute Rehab Services  [phone]   Lyanne Co 09/02/2012, 3:31 PM

## 2012-09-02 NOTE — Progress Notes (Signed)
[facility] PROGRESS NOTE  Joy Wright ZOX:[id number] DOB: 25-Dec-1980 DOA: 09/01/2012 PCP: No primary provider on file.  Brief Narrative: Patient is a 32 year old female with past medical history of pulmonary embolism presented to [facility] with chief complaints of inability to walk due to left sided weakness and paralysis. Patient has been having episodes of numbness and weakness in her extremities since January 2013. She had a similar spell of symptoms between January and March of 2013. Numbness and tingling typically starts in the hands and feet. Symptoms progressed and weakness involving left upper extremity and left lower extremity primarily and to a lesser extent right lower extremity. There is no clear history of urinary urgency. She has not had urinary incontinence. She's had no symptoms involving speech and no facial weakness or numbness. Patient denies any fever, chills or any recent viral illnesses. No changes in bowel movements have been noted. Patient has not had any recent changes in her medications. CT scan of her head today without contrast which was normal. She's had no injury to her neck.  Assessment/Plan: Left-sided weakness - Unclear etiology, MRI with contrast is pending. - Neurology has been consulted, appreciate their input.  Patient has been on a lot of stress recently. She is working 7 days a week, and she is a single mother taking care of 4 children. She has family members that helps her with the care of her kids, however they're not able to help her a lot because they have their own jobs. She has been having some health problems with her youngest daughter, who is 87 years old, at one point her doctors thought she has leukemia. Patient expressed she is under a lot of stress because of these experiences.   Code Status: Full code Family Communication: None  Disposition Plan: To be determined pending  MRI  Consultants:  Neurology  Procedures:  None  Antibiotics:  None  HPI/Subjective: - Has no complaints, she continues to endorse left-sided weakness. She does not appreciate improvement.  Objective: Filed Vitals:   09/01/12 1809 09/01/12 1916 09/02/12 0133 09/02/12 0532  BP: 121/78 134/83 104/58 103/49  Pulse: 69 67 70 68  Temp:  98 F (36.7 C)  97.7 F (36.5 C)  TempSrc:  Oral Oral Oral  Resp: 20 16 16 18   Height:  5\' 2"  (1.575 m)    Weight:  57.5 kg (126 lb 12.2 oz)    SpO2: 100% 100% 100% 100%    Intake/Output Summary (Last 24 hours) at 09/02/12 1125 Last data filed at 09/01/12 2300  Gross per 24 hour  Intake      0 ml  Output      1 ml  Net     -1 ml   Filed Weights   09/01/12 1520 09/01/12 1916  Weight: 63.504 kg (140 lb) 57.5 kg (126 lb 12.2 oz)    Exam:   General:  NAD  Cardiovascular: regular rate and rhythm, without MRG  Respiratory: good air movement, clear to auscultation throughout, no wheezing, ronchi or rales  Abdomen: soft, not tender to palpation, positive bowel sounds  MSK: no peripheral edema  Neuro: CN 2-12 grossly intact, MS 5/5 on the right side, 0-5 in her left leg, 1/5 (anti gravity) in her left upper extremity.  Data Reviewed: Basic Metabolic Panel:  Recent Labs Lab 09/01/12 1550  NA 137  K 4.0  CL 105  CO2 24  GLUCOSE 91  BUN 8  CREATININE 0.70  CALCIUM 9.1  Liver Function Tests:  Recent Labs Lab 09/01/12 1550  AST 15  ALT 9  ALKPHOS 43  BILITOT 0.2*  PROT 7.5  ALBUMIN 3.9   CBC:  Recent Labs Lab 09/01/12 1550  WBC 4.3  NEUTROABS 1.6*  HGB 11.9*  HCT 35.0*  MCV 90.2  PLT 188     Studies: Ct Head Wo Contrast  09/01/2012  *RADIOLOGY REPORT*  Clinical Data: Chronic left-sided weakness  CT HEAD WITHOUT CONTRAST  Technique:  Contiguous axial images were obtained from the base of the skull through the vertex without contrast.  Comparison: None.  Findings: No evidence of parenchymal hemorrhage  or extra-axial fluid collection. No mass lesion, mass effect, or midline shift.  No CT evidence of acute infarction.  Cerebral volume is age appropriate.  No ventriculomegaly.  The visualized paranasal sinuses are essentially clear. The mastoid air cells are unopacified.  No evidence of calvarial fracture.  IMPRESSION: Normal head CT.   Original Report Authenticated By: Charline Bills, M.D.     Scheduled Meds: . enoxaparin (LOVENOX) injection  40 mg Subcutaneous Q24H  . sodium chloride  3 mL Intravenous Q12H  . sodium chloride  3 mL Intravenous Q12H   Continuous Infusions:   Principal Problem:   Weakness of left arm Active Problems:   Numbness and tingling   Pamella Pert, MD [facility] Pager [phone]. If 7 PM - 7 AM, please contact night-coverage at [email or web address], password [facility] 09/02/2012, 11:25 AM  LOS: 1 day

## 2012-09-02 NOTE — Progress Notes (Signed)
Rehab Admissions Coordinator Note:  Patient was screened by Meryl Dare for appropriateness for an Inpatient Acute Rehab Consult.  At this time, we are recommending Inpatient Rehab consult.  Meryl Dare  09/02/2012, 4:05 PM  I can be reached at [phone].

## 2012-09-03 DIAGNOSIS — R5381 Other malaise: Secondary | ICD-10-CM

## 2012-09-03 DIAGNOSIS — F411 Generalized anxiety disorder: Secondary | ICD-10-CM

## 2012-09-03 NOTE — Progress Notes (Signed)
0400 data

## 2012-09-03 NOTE — Progress Notes (Signed)
Upon rounding, pt was found asleep with her face propped up on her LUE which she has been unable to move during previous assessments.

## 2012-09-03 NOTE — Progress Notes (Signed)
Physical Therapy Treatment Patient Details Name: Joy Wright MRN: [id number] DOB: [birth year] Today's Date: 09/03/2012 Time: 4098-1191 PT Time Calculation (min): 23 min  PT Assessment / Plan / Recommendation Comments on Treatment Session  Pt. Was able to use her small cell phone with good fine motor function of right hand and arm.  she continues to present with left UE and LE weakness but more stable and stronger in sit<>stand.  RN made this  therapist aware of possible psych consult.  Pt. verbalized a good bit about having to work 7 days per week all the time and says this is stressful to her (appropriately so).      Follow Up Recommendations  CIR;Supervision/Assistance - 24 hour     Does the patient have the potential to tolerate intense rehabilitation     Barriers to Discharge        Equipment Recommendations  Other (comment) (TBD)    Recommendations for Other Services Rehab consult;OT consult  Frequency Min 4X/week   Plan Discharge plan remains appropriate    Precautions / Restrictions Precautions Precautions: Fall Restrictions Weight Bearing Restrictions: No   Pertinent Vitals/Pain Pain in left hip at 3/10, pt. Repositioned for comfort    Mobility  Bed Mobility Bed Mobility: Rolling Left;Left Sidelying to Sit;Sitting - Scoot to Edge of Bed Rolling Left: 4: Min guard Left Sidelying to Sit: 3: Mod assist Sitting - Scoot to Edge of Bed: 4: Min guard Details for Bed Mobility Assistance: assist primarily for L  LE and to elevate shoulders Transfers Transfers: Sit to Stand;Stand to Sit;Stand Pivot Transfers Sit to Stand: 3: Mod assist;From bed;With upper extremity assist Sit to Stand: Patient Percentage: 60% Stand to Sit: 3: Mod assist;With upper extremity assist;To chair/3-in-1 Stand to Sit: Patient Percentage: 60% Stand Pivot Transfers: 3: Mod assist Stand Pivot Transfers: Patient Percentage: 60% Details for Transfer Assistance: continued to block L knee during  standing trials (X3) and for transfer to recliner.  Pt. had good control in sit to 1/2 stand and in descent back to bed or chair.  Able to stand fully erect but was awkward in achieving stand from 1/2 stand.  Able to detect trunk and LE co-contraction in these efforts. Ambulation/Gait Ambulation/Gait Assistance: Not tested (comment) Assistive device: None Modified Rankin (Stroke Patients Only) Pre-Morbid Rankin Score: No significant disability Modified Rankin: Severe disability    Exercises     PT Diagnosis:    PT Problem List:   PT Treatment Interventions:     PT Goals Acute Rehab PT Goals Time For Goal Achievement: 09/16/12 Potential to Achieve Goals: Good Pt will go Supine/Side to Sit: with min assist PT Goal: Supine/Side to Sit - Progress: Progressing toward goal Pt will go Sit to Stand: with min assist PT Goal: Sit to Stand - Progress: Progressing toward goal Pt will go Stand to Sit: with min assist PT Goal: Stand to Sit - Progress: Progressing toward goal Pt will Transfer Bed to Chair/Chair to Bed: with min assist PT Transfer Goal: Bed to Chair/Chair to Bed - Progress: Progressing toward goal  Visit Information  Last PT Received On: 09/03/12 Assistance Needed: +1    Subjective Data  Subjective: Pt,. reports on several occasions that she works 7 days per week.   Cognition  Cognition Overall Cognitive Status: Appears within functional limits for tasks assessed/performed Arousal/Alertness: Awake/alert Orientation Level: Oriented X4 / Intact Behavior During Session: WFL for tasks performed Cognition - Other Comments: wants to get back home to her kids and her life,  indicates it is difficult for her to work 7 days per week all the time    Insurance risk surveyor Sitting - Balance Support: Feet supported;Right upper extremity supported Static Sitting - Level of Assistance: 5: Stand by assistance  End of Session PT - End of Session Equipment Utilized  During Treatment: Gait belt Activity Tolerance: Patient tolerated treatment well Patient left: in chair;with call bell/phone within reach Nurse Communication: Mobility status   GP     Ferman Hamming 09/03/2012, 9:14 AM Weldon Picking PT Acute Rehab Services [phone] [place] [phone]

## 2012-09-03 NOTE — Progress Notes (Signed)
Rehab admissions - Evaluated for possible admission.  Please see rehab consult done today recommending HH follow up.  Does not meet criteria for an acute inpatient rehab admission.  Call me for questions.  #[phone]

## 2012-09-03 NOTE — Progress Notes (Signed)
[facility] PROGRESS NOTE  Joy Wright BJY:[id number] DOB: 1981/05/18 DOA: 09/01/2012 PCP: No primary provider on file.  Brief Narrative: Patient is a 32 year old female with past medical history of pulmonary embolism presented to [facility] with chief complaints of inability to walk due to left sided weakness and paralysis. Patient has been having episodes of numbness and weakness in her extremities since January 2013. She had a similar spell of symptoms between January and March of 2013. Numbness and tingling typically starts in the hands and feet. Symptoms progressed and weakness involving left upper extremity and left lower extremity primarily and to a lesser extent right lower extremity. There is no clear history of urinary urgency. She has not had urinary incontinence. She's had no symptoms involving speech and no facial weakness or numbness. Patient denies any fever, chills or any recent viral illnesses. No changes in bowel movements have been noted. Patient has not had any recent changes in her medications. CT scan of her head today without contrast which was normal. She's had no injury to her neck.  Assessment/Plan: Left-sided weakness - Unclear etiology, MRI negative, eurology evaluated patient and signed off. I have asked psychiatry to evaluate patient today.   She mentioned on 3/26 that she has been on a lot of stress recently. She is working 7 days a week, and she is a single mother taking care of 4 children. She has family members that helps her with the care of her kids, however they're not able to help her a lot because they have their own jobs. She has been having some health problems with her youngest daughter, who is 67 years old, at one point her doctors thought she has leukemia. Patient expressed she is under a lot of stress because of these experiences.   Code Status: Full code Family Communication: None  Disposition Plan: To be determined pending psych  evaluation. ?Rehab?  Consultants:  Neurology  Psychiatry   Procedures:  None  Antibiotics:  None  HPI/Subjective: - persistent left sided weakness, no complaints otherwise. Scared to some extent that she will not be able to take care of her kids because of her weakness.   Objective: Filed Vitals:   09/02/12 0133 09/02/12 0532 09/02/12 2214 09/03/12 0506  BP: 104/58 103/49 124/82 100/58  Pulse: 70 68 73 64  Temp:  97.7 F (36.5 C) 98.2 F (36.8 C) 97.5 F (36.4 C)  TempSrc: Oral Oral Oral Oral  Resp: 16 18 18 16   Height:      Weight:      SpO2: 100% 100% 100% 100%    Intake/Output Summary (Last 24 hours) at 09/03/12 0865 Last data filed at 09/02/12 1400  Gross per 24 hour  Intake      0 ml  Output      1 ml  Net     -1 ml   Filed Weights   09/01/12 1520 09/01/12 1916  Weight: 63.504 kg (140 lb) 57.5 kg (126 lb 12.2 oz)    Exam:   General:  NAD  Cardiovascular: regular rate and rhythm, without MRG  Respiratory: good air movement, clear to auscultation throughout, no wheezing, ronchi or rales  Abdomen: soft, not tender to palpation, positive bowel sounds  MSK: no peripheral edema  Neuro: CN 2-12 grossly intact, MS 5/5 on the right side, 0-5 in her left leg, 1/5 (anti gravity) in her left upper extremity.  Data Reviewed: Basic Metabolic Panel:  Recent Labs Lab 09/01/12 1550  NA 137  K 4.0  CL 105  CO2 24  GLUCOSE 91  BUN 8  CREATININE 0.70  CALCIUM 9.1   Liver Function Tests:  Recent Labs Lab 09/01/12 1550  AST 15  ALT 9  ALKPHOS 43  BILITOT 0.2*  PROT 7.5  ALBUMIN 3.9   CBC:  Recent Labs Lab 09/01/12 1550  WBC 4.3  NEUTROABS 1.6*  HGB 11.9*  HCT 35.0*  MCV 90.2  PLT 188     Studies: Ct Head Wo Contrast  09/01/2012  *RADIOLOGY REPORT*  Clinical Data: Chronic left-sided weakness  CT HEAD WITHOUT CONTRAST  Technique:  Contiguous axial images were obtained from the base of the skull through the vertex without contrast.   Comparison: None.  Findings: No evidence of parenchymal hemorrhage or extra-axial fluid collection. No mass lesion, mass effect, or midline shift.  No CT evidence of acute infarction.  Cerebral volume is age appropriate.  No ventriculomegaly.  The visualized paranasal sinuses are essentially clear. The mastoid air cells are unopacified.  No evidence of calvarial fracture.  IMPRESSION: Normal head CT.   Original Report Authenticated By: Charline Bills, M.D.    Mr Brain Wo Contrast  09/02/2012  *RADIOLOGY REPORT*  Clinical Data:  32 year old female with history of pulmonary embolism.  Left side weakness, paralysis.  Episodes of numbness and weakness since January.  No known injury.  The patient declined the postcontrast portion of the exams as had been initially planned.  MRI HEAD WITHOUT CONTRAST MRI CERVICAL SPINE WITHOUT CONTRAST  Technique:  Multiplanar, multiecho pulse sequences of the brain and surrounding structures, and cervical spine, to include the craniocervical junction and cervicothoracic junction, were obtained without intravenous contrast.  Comparison:  Head CT without contrast 09/01/2012.  MRI HEAD  Findings:  Moderate wrap artifact affecting diffusion weighted imaging.  Artifact in the medial right thalamus.  No definite No restricted diffusion to suggest acute infarction.  Normal cerebral volume. Major intracranial vascular flow voids are preserved. No midline shift, ventriculomegaly, mass effect, evidence of mass lesion, extra-axial collection or acute intracranial hemorrhage.  Cervicomedullary junction and pituitary are within normal limits.  Wallace Cullens and white matter signal is within normal limits throughout the brain.  Normal bone marrow signal.  Visualized orbit soft tissues are within normal limits.  Minor paranasal sinus mucosal thickening.  Small nasopharyngeal retention cyst.  Mastoids are clear.  Grossly normal visualized internal auditory structures.  Negative scalp soft tissues.   IMPRESSION: 1. Normal noncontrast MRI appearance of the brain. 2.  Cervical spine findings are below.  MRI CERVICAL SPINE  Findings: Straightening of cervical lordosis. No marrow edema or evidence of acute osseous abnormality.  4-5 mm T2 hyperintense right thyroid nodule is too small to characterize and most likely benign in the absence of known risk factors for thyroid carcinoma.  Other visible thyroid parenchyma is within normal limits.  Other visualized paraspinal soft tissues are within normal limits.  C2-C3:  Negative.  C3-C4:  Negative.  C4-C5:  Negative.  C5-C6:  Negative.  C6-C7:  Negative.  C7-T1:  Negative. Negative visualized upper thoracic levels.  Spinal cord signal is within normal limits at all visualized levels.  IMPRESSION: Normal MRI appearance of the cervical spine.   Original Report Authenticated By: Erskine Speed, M.D.    Mr Laqueta Jean Contrast  09/02/2012  *RADIOLOGY REPORT*  Clinical Data:  32 year old female with history of left side weakness, paralysis, episodes of numbness and weakness.  Returns for postcontrast imaging, which the patient had declined earlier.  Contrast:  12 ml MultiHance.  Comparison:  Noncontrast brain and cervical MRI from the same day reported separately.  MRI HEAD WITH CONTRAST MRI CERVICAL SPINE WITH CONTRAST  Technique: Post contrast only MR imaging of the brain and surrounding structures, and cervical spine, to include the craniocervical junction and cervicothoracic junction.  MRI HEAD  Findings:  No abnormal enhancement identified.  Stable and normal cerebral morphology as described on the earlier noncontrast study. 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  IMPRESSION: 1.  Normal post contrast MRI appearance of the brain. 2.  Cervical postcontrast findings are below.  MRI CERVICAL SPINE  Findings: Precontrast axial T1-weighted images also were obtained for comparison.   No abnormal intradural enhancement.  No abnormal no abnormal enhancement of the visualized osseous structures or paraspinal soft tissues.  Of note, the previously described small right thyroid nodule does not enhance.  IMPRESSION: Normal post contrast MRI appearance of the cervical spine.   Original Report Authenticated By: Erskine Speed, M.D.    Mr Cervical Spine Wo Contrast  09/02/2012  *RADIOLOGY REPORT*  Clinical Data:  32 year old female with history of pulmonary embolism.  Left side weakness, paralysis.  Episodes of numbness and weakness since January.  No known injury.  The patient declined the postcontrast portion of the exams as had been initially planned.  MRI HEAD WITHOUT CONTRAST MRI CERVICAL SPINE WITHOUT CONTRAST  Technique:  Multiplanar, multiecho pulse sequences of the brain and surrounding structures, and cervical spine, to include the craniocervical junction and cervicothoracic junction, were obtained without intravenous contrast.  Comparison:  Head CT without contrast 09/01/2012.  MRI HEAD  Findings:  Moderate wrap artifact affecting diffusion weighted imaging.  Artifact in the medial right thalamus.  No definite No restricted diffusion to suggest acute infarction.  Normal cerebral volume. Major intracranial vascular flow voids are preserved. No midline shift, ventriculomegaly, mass effect, evidence of mass lesion, extra-axial collection or acute intracranial hemorrhage.  Cervicomedullary junction and pituitary are within normal limits.  Wallace Cullens and white matter signal is within normal limits throughout the brain.  Normal bone marrow signal.  Visualized orbit soft tissues are within normal limits.  Minor paranasal sinus mucosal thickening.  Small nasopharyngeal retention cyst.  Mastoids are clear.  Grossly normal visualized internal auditory structures.  Negative scalp soft tissues.  IMPRESSION: 1. Normal noncontrast MRI appearance of the brain. 2.  Cervical spine findings are below.  MRI CERVICAL  SPINE  Findings: Straightening of cervical lordosis. No marrow edema or evidence of acute osseous abnormality.  4-5 mm T2 hyperintense right thyroid nodule is too small to characterize and most likely benign in the absence of known risk factors for thyroid carcinoma.  Other visible thyroid parenchyma is within normal limits.  Other visualized paraspinal soft tissues are within normal limits.  C2-C3:  Negative.  C3-C4:  Negative.  C4-C5:  Negative.  C5-C6:  Negative.  C6-C7:  Negative.  C7-T1:  Negative. Negative visualized upper thoracic levels.  Spinal cord signal is within normal limits at all visualized levels.  IMPRESSION: Normal MRI appearance of the cervical spine.   Original Report Authenticated By: Erskine Speed, M.D.    Mr Cervical Spine W Contrast  09/02/2012  *RADIOLOGY REPORT*  Clinical Data:  32 year old female with history of left side weakness, paralysis, episodes of numbness and weakness.  Returns for postcontrast imaging, which the patient had declined earlier.  Contrast:  12 ml MultiHance.  Comparison:  Noncontrast  brain and cervical MRI from the same day reported separately.  MRI HEAD WITH CONTRAST MRI CERVICAL SPINE WITH CONTRAST  Technique: Post contrast only MR imaging of the brain and surrounding structures, and cervical spine, to include the craniocervical junction and cervicothoracic junction.  MRI HEAD  Findings:  No abnormal enhancement identified.  Stable and normal cerebral morphology as described on the earlier noncontrast study. 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  IMPRESSION: 1.  Normal post contrast MRI appearance of the brain. 2.  Cervical postcontrast findings are below.  MRI CERVICAL SPINE  Findings: Precontrast axial T1-weighted images also were obtained for comparison.  No abnormal intradural enhancement.  No abnormal no abnormal enhancement of the visualized osseous  structures or paraspinal soft tissues.  Of note, the previously described small right thyroid nodule does not enhance.  IMPRESSION: Normal post contrast MRI appearance of the cervical spine.   Original Report Authenticated By: Erskine Speed, M.D.     Scheduled Meds: . enoxaparin (LOVENOX) injection  40 mg Subcutaneous Q24H  . sodium chloride  3 mL Intravenous Q12H  . sodium chloride  3 mL Intravenous Q12H   Continuous Infusions:   Principal Problem:   Weakness of left arm Active Problems:   Numbness and tingling  Pamella Pert, MD [facility] Pager [phone]. If 7 PM - 7 AM, please contact night-coverage at [email or web address], password [facility] 09/03/2012, 9:28 AM  LOS: 2 days

## 2012-09-03 NOTE — Consult Note (Signed)
Reason for Consult: Conversion disorder Referring Physician: [name] is an 32 y.o. female.  HPI: Patient was seen and chart reviewed. Psychiatric consultation was requested to rule out conversion disorder as patient has been suffering with the weakness of her leg and dialysis. Patient has reported numbness and pain in her lower extremity since 2013. Patient does seem to [facility] symptoms in the past. Patient was induced into her emotions related to disability in her leg. She is minimizing her problems, and coping to get better, and go back to home and work. Patient reported that she had suffered with the pulmonary embolism 2010, and was on birth-control pills. Patient reported her family history is significant for blurred class at her mom's dad paternal grandmother paternal grandfather. Patient reported she can wiggle only pinky and big toe. Her neuromuscular examination, including CT scan, was normal. She is no history of injury. Patient has no history of psychiatric illness or psychiatric treatment in the past. Patient denies legal problems drug problems with the medications. Patient lives by herself with her 4 younger children ages 16, 94, 58 and 52 months. Patient stated that her tubes were tied down, not going to have any more pregnancies. Patient mother and sister were Support her and helpful. Patient children's fathers provide child support and being involved in the life from outside. Patient stated she works 7 days a week from 7 AM to 3 PM. She has a financial problems not able to pay her bills and medical base. Patient denied involvement with the Department of Social Services. Patient stated her children well behaved. Patient has positive mood and wishing to get better soon.   MSE: Patient appeared sitting in a chair with extending her left leg. Patient was calm quite, cooperative to. She has normal rate and volume of speech or thought process is linear and goal-directed. She has  denied suicidal onset ideation, intents or plans. Patient has no evidence of psychotic symptoms.  Past Medical History  Diagnosis Date  . PE (pulmonary embolism)     Past Surgical History  Procedure Laterality Date  . Tubal ligation      History reviewed. No pertinent family history.  Social History:  reports that she has never smoked. She does not have any smokeless tobacco history on file. She reports that  drinks alcohol. She reports that she does not use illicit drugs.  Allergies: No Known Allergies  Medications: I have reviewed the patient's current medications.  Results for orders placed during the hospital encounter of 09/01/12 (from the past 48 hour(s))  CBC WITH DIFFERENTIAL     Status: Abnormal   Collection Time    09/01/12  3:50 PM      Result Value Range   WBC 4.3  4.0 - 10.5 K/uL   RBC 3.88  3.87 - 5.11 MIL/uL   Hemoglobin 11.9 (*) 12.0 - 15.0 g/dL   HCT 16.1 (*) 09.6 - 04.5 %   MCV 90.2  78.0 - 100.0 fL   MCH 30.7  26.0 - 34.0 pg   MCHC 34.0  30.0 - 36.0 g/dL   RDW 40.9  81.1 - 91.4 %   Platelets 188  150 - 400 K/uL   Neutrophils Relative 37 (*) 43 - 77 %   Neutro Abs 1.6 (*) 1.7 - 7.7 K/uL   Lymphocytes Relative 45  12 - 46 %   Lymphs Abs 2.0  0.7 - 4.0 K/uL   Monocytes Relative 12  3 - 12 %  Monocytes Absolute 0.5  0.1 - 1.0 K/uL   Eosinophils Relative 5  0 - 5 %   Eosinophils Absolute 0.2  0.0 - 0.7 K/uL   Basophils Relative 1  0 - 1 %   Basophils Absolute 0.0  0.0 - 0.1 K/uL  COMPREHENSIVE METABOLIC PANEL     Status: Abnormal   Collection Time    09/01/12  3:50 PM      Result Value Range   Sodium 137  135 - 145 mEq/L   Potassium 4.0  3.5 - 5.1 mEq/L   Chloride 105  96 - 112 mEq/L   CO2 24  19 - 32 mEq/L   Glucose, Bld 91  70 - 99 mg/dL   BUN 8  6 - 23 mg/dL   Creatinine, Ser 1.61  0.50 - 1.10 mg/dL   Calcium 9.1  8.4 - 09.6 mg/dL   Total Protein 7.5  6.0 - 8.3 g/dL   Albumin 3.9  3.5 - 5.2 g/dL   AST 15  0 - 37 U/L   ALT 9  0 - 35 U/L    Alkaline Phosphatase 43  39 - 117 U/L   Total Bilirubin 0.2 (*) 0.3 - 1.2 mg/dL   GFR calc non Af Amer >90  >90 mL/min   GFR calc Af Amer >90  >90 mL/min   Comment:            The eGFR has been calculated     using the CKD EPI equation.     This calculation has not been     validated in all clinical     situations.     eGFR's persistently     <90 mL/min signify     possible Chronic Kidney Disease.  PREGNANCY, URINE     Status: None   Collection Time    09/01/12  4:09 PM      Result Value Range   Preg Test, Ur NEGATIVE  NEGATIVE   Comment:            THE SENSITIVITY OF THIS     METHODOLOGY IS >20 mIU/mL.    Ct Head Wo Contrast  09/01/2012  *RADIOLOGY REPORT*  Clinical Data: Chronic left-sided weakness  CT HEAD WITHOUT CONTRAST  Technique:  Contiguous axial images were obtained from the base of the skull through the vertex without contrast.  Comparison: None.  Findings: No evidence of parenchymal hemorrhage or extra-axial fluid collection. No mass lesion, mass effect, or midline shift.  No CT evidence of acute infarction.  Cerebral volume is age appropriate.  No ventriculomegaly.  The visualized paranasal sinuses are essentially clear. The mastoid air cells are unopacified.  No evidence of calvarial fracture.  IMPRESSION: Normal head CT.   Original Report Authenticated By: Charline Bills, M.D.    Mr Brain Wo Contrast  09/02/2012  *RADIOLOGY REPORT*  Clinical Data:  32 year old female with history of pulmonary embolism.  Left side weakness, paralysis.  Episodes of numbness and weakness since January.  No known injury.  The patient declined the postcontrast portion of the exams as had been initially planned.  MRI HEAD WITHOUT CONTRAST MRI CERVICAL SPINE WITHOUT CONTRAST  Technique:  Multiplanar, multiecho pulse sequences of the brain and surrounding structures, and cervical spine, to include the craniocervical junction and cervicothoracic junction, were obtained without intravenous contrast.   Comparison:  Head CT without contrast 09/01/2012.  MRI HEAD  Findings:  Moderate wrap artifact affecting diffusion weighted imaging.  Artifact in the medial right thalamus.  No definite No restricted diffusion to suggest acute infarction.  Normal cerebral volume. Major intracranial vascular flow voids are preserved. No midline shift, ventriculomegaly, mass effect, evidence of mass lesion, extra-axial collection or acute intracranial hemorrhage.  Cervicomedullary junction and pituitary are within normal limits.  Wallace Cullens and white matter signal is within normal limits throughout the brain.  Normal bone marrow signal.  Visualized orbit soft tissues are within normal limits.  Minor paranasal sinus mucosal thickening.  Small nasopharyngeal retention cyst.  Mastoids are clear.  Grossly normal visualized internal auditory structures.  Negative scalp soft tissues.  IMPRESSION: 1. Normal noncontrast MRI appearance of the brain. 2.  Cervical spine findings are below.  MRI CERVICAL SPINE  Findings: Straightening of cervical lordosis. No marrow edema or evidence of acute osseous abnormality.  4-5 mm T2 hyperintense right thyroid nodule is too small to characterize and most likely benign in the absence of known risk factors for thyroid carcinoma.  Other visible thyroid parenchyma is within normal limits.  Other visualized paraspinal soft tissues are within normal limits.  C2-C3:  Negative.  C3-C4:  Negative.  C4-C5:  Negative.  C5-C6:  Negative.  C6-C7:  Negative.  C7-T1:  Negative. Negative visualized upper thoracic levels.  Spinal cord signal is within normal limits at all visualized levels.  IMPRESSION: Normal MRI appearance of the cervical spine.   Original Report Authenticated By: Erskine Speed, M.D.    Mr Laqueta Jean Contrast  09/02/2012  *RADIOLOGY REPORT*  Clinical Data:  32 year old female with history of left side weakness, paralysis, episodes of numbness and weakness.  Returns for postcontrast imaging, which the patient  had declined earlier.  Contrast:  12 ml MultiHance.  Comparison:  Noncontrast brain and cervical MRI from the same day reported separately.  MRI HEAD WITH CONTRAST MRI CERVICAL SPINE WITH CONTRAST  Technique: Post contrast only MR imaging of the brain and surrounding structures, and cervical spine, to include the craniocervical junction and cervicothoracic junction.  MRI HEAD  Findings:  No abnormal enhancement identified.  Stable and normal cerebral morphology as described on the earlier noncontrast study. 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  IMPRESSION: 1.  Normal post contrast MRI appearance of the brain. 2.  Cervical postcontrast findings are below.  MRI CERVICAL SPINE  Findings: Precontrast axial T1-weighted images also were obtained for comparison.  No abnormal intradural enhancement.  No abnormal no abnormal enhancement of the visualized osseous structures or paraspinal soft tissues.  Of note, the previously described small right thyroid nodule does not enhance.  IMPRESSION: Normal post contrast MRI appearance of the cervical spine.   Original Report Authenticated By: Erskine Speed, M.D.    Mr Cervical Spine Wo Contrast  09/02/2012  *RADIOLOGY REPORT*  Clinical Data:  32 year old female with history of pulmonary embolism.  Left side weakness, paralysis.  Episodes of numbness and weakness since January.  No known injury.  The patient declined the postcontrast portion of the exams as had been initially planned.  MRI HEAD WITHOUT CONTRAST MRI CERVICAL SPINE WITHOUT CONTRAST  Technique:  Multiplanar, multiecho pulse sequences of the brain and surrounding structures, and cervical spine, to include the craniocervical junction and cervicothoracic junction, were obtained without intravenous contrast.  Comparison:  Head CT without contrast 09/01/2012.  MRI HEAD  Findings:  Moderate wrap artifact affecting diffusion  weighted imaging.  Artifact in the medial right thalamus.  No definite No restricted diffusion to suggest acute infarction.  Normal cerebral volume. Major intracranial vascular flow voids are preserved. No midline shift, ventriculomegaly, mass effect, evidence of mass lesion, extra-axial collection or acute intracranial hemorrhage.  Cervicomedullary junction and pituitary are within normal limits.  Wallace Cullens and white matter signal is within normal limits throughout the brain.  Normal bone marrow signal.  Visualized orbit soft tissues are within normal limits.  Minor paranasal sinus mucosal thickening.  Small nasopharyngeal retention cyst.  Mastoids are clear.  Grossly normal visualized internal auditory structures.  Negative scalp soft tissues.  IMPRESSION: 1. Normal noncontrast MRI appearance of the brain. 2.  Cervical spine findings are below.  MRI CERVICAL SPINE  Findings: Straightening of cervical lordosis. No marrow edema or evidence of acute osseous abnormality.  4-5 mm T2 hyperintense right thyroid nodule is too small to characterize and most likely benign in the absence of known risk factors for thyroid carcinoma.  Other visible thyroid parenchyma is within normal limits.  Other visualized paraspinal soft tissues are within normal limits.  C2-C3:  Negative.  C3-C4:  Negative.  C4-C5:  Negative.  C5-C6:  Negative.  C6-C7:  Negative.  C7-T1:  Negative. Negative visualized upper thoracic levels.  Spinal cord signal is within normal limits at all visualized levels.  IMPRESSION: Normal MRI appearance of the cervical spine.   Original Report Authenticated By: Erskine Speed, M.D.    Mr Cervical Spine W Contrast  09/02/2012  *RADIOLOGY REPORT*  Clinical Data:  32 year old female with history of left side weakness, paralysis, episodes of numbness and weakness.  Returns for postcontrast imaging, which the patient had declined earlier.  Contrast:  12 ml MultiHance.  Comparison:  Noncontrast brain and cervical MRI from the  same day reported separately.  MRI HEAD WITH CONTRAST MRI CERVICAL SPINE WITH CONTRAST  Technique: Post contrast only MR imaging of the brain and surrounding structures, and cervical spine, to include the craniocervical junction and cervicothoracic junction.  MRI HEAD  Findings:  No abnormal enhancement identified.  Stable and normal cerebral morphology as described on the earlier noncontrast study. 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  IMPRESSION: 1.  Normal post contrast MRI appearance of the brain. 2.  Cervical postcontrast findings are below.  MRI CERVICAL SPINE  Findings: Precontrast axial T1-weighted images also were obtained for comparison.  No abnormal intradural enhancement.  No abnormal no abnormal enhancement of the visualized osseous structures or paraspinal soft tissues.  Of note, the previously described small right thyroid nodule does not enhance.  IMPRESSION: Normal post contrast MRI appearance of the cervical spine.   Original Report Authenticated By: Erskine Speed, M.D.     Positive for anxiety and good mood Blood pressure 100/58, pulse 64, temperature 97.5 F (36.4 C), temperature source Oral, resp. rate 16, height 5\' 2"  (1.575 m), weight 126 lb 12.2 oz (57.5 kg), last menstrual period 08/23/2012, SpO2 100.00%.   Assessment/Plan: Rule out conversion disorder.  Recommendation: Patient does not meet criteria for acute psychiatric hospitalization. May benefit from the outpatient counseling services as she has multiple psychosocial stresses related to excess work for a week and taking care of 4 children sleep, financial difficulties  and limited support. No medications recommended at this visit.   Joy Wright,Joy R. 09/03/2012, 10:35 AM

## 2012-09-03 NOTE — Progress Notes (Signed)
Utilization review completed. Kelleen Stolze, RN, BSN. 

## 2012-09-03 NOTE — Consult Note (Signed)
Physical Medicine and Rehabilitation Consult Reason for Consult: Question CVA Referring Physician: [facility]   HPI: Joy Wright is a 32 y.o. right-handed female with documented history of pulmonary emboli on no present medications upon admission. Presented 09/01/2012 of left-sided weakness and numbness. Cranial CT scan and MRI of the brain unremarkable. MRI cervical spine normal appearance. Neurology consulted with workup ongoing question of psychophysiologic contributions to her symptomatology and presently maintained on subcutaneous Lovenox for DVT prophylaxis. Physical therapy evaluation completed 09/02/2012 and ongoing with recommendations made for physical medicine rehabilitation consult to consider inpatient rehabilitation services   Review of Systems  Neurological: Positive for weakness and headaches.       Numbness   Past Medical History  Diagnosis Date  . PE (pulmonary embolism)    Past Surgical History  Procedure Laterality Date  . Tubal ligation     History reviewed. No pertinent family history. Social History:  reports that she has never smoked. She does not have any smokeless tobacco history on file. She reports that  drinks alcohol. She reports that she does not use illicit drugs. Allergies: No Known Allergies No prescriptions prior to admission    Home: Home Living Lives With: Family Available Help at Discharge: Family;Available 24 hours/day Type of Home: House Home Access: Stairs to enter [employer] of Steps: 4 Entrance Stairs-Rails: Right Home Layout: One level Home Adaptive Equipment: None Additional Comments: pt seems to have good family support, states that her mother and brother and sister can help her  Functional History: Prior Function Able to Take Stairs?: Yes Driving: Yes Vocation: Full time employment Comments: 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, paperwork, etc Functional Status:  Mobility: Bed Mobility Bed  Mobility: Rolling Left;Left Sidelying to Sit;Sitting - Scoot to Edge of Bed Rolling Left: 3: Mod assist Left Sidelying to Sit: 3: Mod assist Sitting - Scoot to Edge of Bed: 4: Min assist Transfers Transfers: Sit to Stand;Stand to [employer] Transfers Sit to Stand: 1: +2 Total assist;From bed;From chair/3-in-1;With upper extremity assist Sit to Stand: Patient Percentage: 50% Stand to Sit: 1: +2 Total assist;To chair/3-in-1;With upper extremity assist Stand to Sit: Patient Percentage: 60% Stand Pivot Transfers: 1: +2 Total assist Stand Pivot Transfers: Patient Percentage: 60% Ambulation/Gait Ambulation/Gait Assistance: Not tested (comment) Stairs: No Wheelchair Mobility Wheelchair Mobility: No  ADL:    Cognition: Cognition Arousal/Alertness: Awake/alert Orientation Level: Oriented X4 Cognition Overall Cognitive Status: Appears within functional limits for tasks assessed/performed Arousal/Alertness: Awake/alert Orientation Level: Oriented X4 / Intact Behavior During Session: WFL for tasks performed Cognition - Other Comments: seems appropriately concerned about her current situation and has many questions about how she will get better and return to her normal function  Blood pressure 100/58, pulse 64, temperature 97.5 F (36.4 C), temperature source Oral, resp. rate 16, height 5\' 2"  (1.575 m), weight 57.5 kg (126 lb 12.2 oz), last menstrual period 08/23/2012, SpO2 100.00%. Physical Exam  Vitals reviewed. Constitutional: She appears well-developed.  HENT:  Head: Normocephalic.  Eyes: EOM are normal.  Neck: Neck supple. No thyromegaly present.  Cardiovascular: Normal rate and regular rhythm.   Pulmonary/Chest: Breath sounds normal. No respiratory distress.  Abdominal: Soft. Bowel sounds are normal. She exhibits no distension.  Musculoskeletal: She exhibits no edema.  Neurological:  Mood is flat but appropriate. Throughout the exam she requested discharge to home. She was  able to name person, place as well the ages of her children. Followed simple verbal commands. No CN signs. Sensory exam with variable LT and  PP loss along predominantly distal aspects of left arm and leg. Inconsistent effort and movement of LUE. Hoover's test positive LLE/RLE.  Skin: Skin is warm and dry.    No results found for this or any previous visit (from the past 24 hour(s)). Ct Head Wo Contrast  09/01/2012  *RADIOLOGY REPORT*  Clinical Data: Chronic left-sided weakness  CT HEAD WITHOUT CONTRAST  Technique:  Contiguous axial images were obtained from the base of the skull through the vertex without contrast.  Comparison: None.  Findings: No evidence of parenchymal hemorrhage or extra-axial fluid collection. No mass lesion, mass effect, or midline shift.  No CT evidence of acute infarction.  Cerebral volume is age appropriate.  No ventriculomegaly.  The visualized paranasal sinuses are essentially clear. The mastoid air cells are unopacified.  No evidence of calvarial fracture.  IMPRESSION: Normal head CT.   Original Report Authenticated By: Charline Bills, M.D.    Mr Brain Wo Contrast  09/02/2012  *RADIOLOGY REPORT*  Clinical Data:  32 year old female with history of pulmonary embolism.  Left side weakness, paralysis.  Episodes of numbness and weakness since January.  No known injury.  The patient declined the postcontrast portion of the exams as had been initially planned.  MRI HEAD WITHOUT CONTRAST MRI CERVICAL SPINE WITHOUT CONTRAST  Technique:  Multiplanar, multiecho pulse sequences of the brain and surrounding structures, and cervical spine, to include the craniocervical junction and cervicothoracic junction, were obtained without intravenous contrast.  Comparison:  Head CT without contrast 09/01/2012.  MRI HEAD  Findings:  Moderate wrap artifact affecting diffusion weighted imaging.  Artifact in the medial right thalamus.  No definite No restricted diffusion to suggest acute infarction.   Normal cerebral volume. Major intracranial vascular flow voids are preserved. No midline shift, ventriculomegaly, mass effect, evidence of mass lesion, extra-axial collection or acute intracranial hemorrhage.  Cervicomedullary junction and pituitary are within normal limits.  Wallace Cullens and white matter signal is within normal limits throughout the brain.  Normal bone marrow signal.  Visualized orbit soft tissues are within normal limits.  Minor paranasal sinus mucosal thickening.  Small nasopharyngeal retention cyst.  Mastoids are clear.  Grossly normal visualized internal auditory structures.  Negative scalp soft tissues.  IMPRESSION: 1. Normal noncontrast MRI appearance of the brain. 2.  Cervical spine findings are below.  MRI CERVICAL SPINE  Findings: Straightening of cervical lordosis. No marrow edema or evidence of acute osseous abnormality.  4-5 mm T2 hyperintense right thyroid nodule is too small to characterize and most likely benign in the absence of known risk factors for thyroid carcinoma.  Other visible thyroid parenchyma is within normal limits.  Other visualized paraspinal soft tissues are within normal limits.  C2-C3:  Negative.  C3-C4:  Negative.  C4-C5:  Negative.  C5-C6:  Negative.  C6-C7:  Negative.  C7-T1:  Negative. Negative visualized upper thoracic levels.  Spinal cord signal is within normal limits at all visualized levels.  IMPRESSION: Normal MRI appearance of the cervical spine.   Original Report Authenticated By: Erskine Speed, M.D.    Mr Laqueta Jean Contrast  09/02/2012  *RADIOLOGY REPORT*  Clinical Data:  32 year old female with history of left side weakness, paralysis, episodes of numbness and weakness.  Returns for postcontrast imaging, which the patient had declined earlier.  Contrast:  12 ml MultiHance.  Comparison:  Noncontrast brain and cervical MRI from the same day reported separately.  MRI HEAD WITH CONTRAST MRI CERVICAL SPINE WITH CONTRAST  Technique: Post contrast only MR imaging of  the brain and surrounding structures, and cervical spine, to include the craniocervical junction and cervicothoracic junction.  MRI HEAD  Findings:  No abnormal enhancement identified.  Stable and normal cerebral morphology as described on the earlier noncontrast study. 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  IMPRESSION: 1.  Normal post contrast MRI appearance of the brain. 2.  Cervical postcontrast findings are below.  MRI CERVICAL SPINE  Findings: Precontrast axial T1-weighted images also were obtained for comparison.  No abnormal intradural enhancement.  No abnormal no abnormal enhancement of the visualized osseous structures or paraspinal soft tissues.  Of note, the previously described small right thyroid nodule does not enhance.  IMPRESSION: Normal post contrast MRI appearance of the cervical spine.   Original Report Authenticated By: Erskine Speed, M.D.    Mr Cervical Spine Wo Contrast  09/02/2012  *RADIOLOGY REPORT*  Clinical Data:  32 year old female with history of pulmonary embolism.  Left side weakness, paralysis.  Episodes of numbness and weakness since January.  No known injury.  The patient declined the postcontrast portion of the exams as had been initially planned.  MRI HEAD WITHOUT CONTRAST MRI CERVICAL SPINE WITHOUT CONTRAST  Technique:  Multiplanar, multiecho pulse sequences of the brain and surrounding structures, and cervical spine, to include the craniocervical junction and cervicothoracic junction, were obtained without intravenous contrast.  Comparison:  Head CT without contrast 09/01/2012.  MRI HEAD  Findings:  Moderate wrap artifact affecting diffusion weighted imaging.  Artifact in the medial right thalamus.  No definite No restricted diffusion to suggest acute infarction.  Normal cerebral volume. Major intracranial vascular flow voids are preserved. No midline shift, ventriculomegaly,  mass effect, evidence of mass lesion, extra-axial collection or acute intracranial hemorrhage.  Cervicomedullary junction and pituitary are within normal limits.  Wallace Cullens and white matter signal is within normal limits throughout the brain.  Normal bone marrow signal.  Visualized orbit soft tissues are within normal limits.  Minor paranasal sinus mucosal thickening.  Small nasopharyngeal retention cyst.  Mastoids are clear.  Grossly normal visualized internal auditory structures.  Negative scalp soft tissues.  IMPRESSION: 1. Normal noncontrast MRI appearance of the brain. 2.  Cervical spine findings are below.  MRI CERVICAL SPINE  Findings: Straightening of cervical lordosis. No marrow edema or evidence of acute osseous abnormality.  4-5 mm T2 hyperintense right thyroid nodule is too small to characterize and most likely benign in the absence of known risk factors for thyroid carcinoma.  Other visible thyroid parenchyma is within normal limits.  Other visualized paraspinal soft tissues are within normal limits.  C2-C3:  Negative.  C3-C4:  Negative.  C4-C5:  Negative.  C5-C6:  Negative.  C6-C7:  Negative.  C7-T1:  Negative. Negative visualized upper thoracic levels.  Spinal cord signal is within normal limits at all visualized levels.  IMPRESSION: Normal MRI appearance of the cervical spine.   Original Report Authenticated By: Erskine Speed, M.D.    Mr Cervical Spine W Contrast  09/02/2012  *RADIOLOGY REPORT*  Clinical Data:  32 year old female with history of left side weakness, paralysis, episodes of numbness and weakness.  Returns for postcontrast imaging, which the patient had declined earlier.  Contrast:  12 ml MultiHance.  Comparison:  Noncontrast brain and cervical MRI from the same day reported separately.  MRI HEAD WITH CONTRAST MRI CERVICAL SPINE WITH CONTRAST  Technique: Post contrast only MR imaging of the brain and surrounding structures, and cervical spine, to include  the craniocervical junction and  cervicothoracic junction.  MRI HEAD  Findings:  No abnormal enhancement identified.  Stable and normal cerebral morphology as described on the earlier noncontrast study. 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  IMPRESSION: 1.  Normal post contrast MRI appearance of the brain. 2.  Cervical postcontrast findings are below.  MRI CERVICAL SPINE  Findings: Precontrast axial T1-weighted images also were obtained for comparison.  No abnormal intradural enhancement.  No abnormal no abnormal enhancement of the visualized osseous structures or paraspinal soft tissues.  Of note, the previously described small right thyroid nodule does not enhance.  IMPRESSION: Normal post contrast MRI appearance of the cervical spine.   Original Report Authenticated By: Erskine Speed, M.D.     Assessment/Plan: Diagnosis: conversion reaction/non-organic weakness 1. Does the need for close, 24 hr/day medical supervision in concert with the patient's rehab needs make it unreasonable for this patient to be served in a less intensive setting? No 2. Co-Morbidities requiring supervision/potential complications: n/a 3. Due to bowel management, safety and disease management, does the patient require 24 hr/day rehab nursing? No 4. Does the patient require coordinated care of a physician, rehab nurse, n/a to address physical and functional deficits in the context of the above medical diagnosis(es)? No Addressing deficits in the following areas: locomotion, strength, bowel/bladder control and dressing 5. Can the patient actively participate in an intensive therapy program of at least 3 hrs of therapy per day at least 5 days per week? No 6. The potential for patient to make measurable gains while on inpatient rehab is poor 7. Anticipated functional outcomes upon discharge from inpatient rehab are n/a with PT, n/a with OT, n/a with  SLP. 8. Estimated rehab length of stay to reach the above functional goals is: n/a 9. Does the patient have adequate social supports to accommodate these discharge functional goals? Potentially 10. Anticipated D/C setting: Home 11. Anticipated post D/C treatments: HH therapy 12. Overall Rehab/Functional Prognosis: good  RECOMMENDATIONS: This patient's condition is appropriate for continued rehabilitative care in the following setting: [facility] Patient has agreed to participate in recommended program. Yes Note that insurance prior authorization may be required for reimbursement for recommended care.  Comment: Provided motivation to the patient to work hard with therapies on acute so that she could become stronger and return home.  Ranelle Oyster, MD, [place] [facility]     09/03/2012

## 2012-09-04 NOTE — Progress Notes (Signed)
Patient discharge to home with family at 89. Discharge instruction provided by RN, patient verbalize understanding, no further questions asked. IV, telemetry removed. Patient escorted off unit by NT.

## 2012-09-04 NOTE — Care Management Note (Signed)
    Page 1 of 2   09/04/2012     1:36:07 PM   CARE MANAGEMENT NOTE 09/04/2012  Patient:  Joy Wright, Joy Wright   Account Number:  [id number]  Date Initiated:  09/04/2012  Documentation initiated by:  [facility]  Subjective/Objective Assessment:   admitted for TIA workup     Action/Plan:   Pt/Ot evals- recommending HHPT nad HHOT   Anticipated DC Date:  09/04/2012   Anticipated DC Plan:  HOME W HOME HEALTH SERVICES      DC Planning Services  CM consult      Choice offered to / List presented to:  C-1 Patient   DME arranged  Levan Hurst      DME agency  [facility]     HH arranged  HH-2 PT  HH-3 OT      Status of service:  Completed, signed off Medicare Important Message given?   (If response is "NO", the following Medicare IM given date fields will be blank) Date Medicare IM given:   Date Additional Medicare IM given:    Discharge Disposition:  HOME W HOME HEALTH SERVICES  Per UR Regulation:  Reviewed for med. necessity/level of care/duration of stay  If discussed at Long Length of Stay Meetings, dates discussed:    Comments:  09/04/12 Spoke with patient about d/c plan. Patient states that she has insurance through her employer but does not have insurance card with her. I called her employer's human resources dept [phone] and spoke with manager Bonnell Public. Confirmed that patient has coverage with [employer] which uses the [employer] for [facility]. They will be switching to a [employer] network on 09/08/12. Patient chose [facility] from [facility] list. Contacted Marie at [facility] and set up HHPT and HHOT. [facility] to deliver rolkling walker prior to discharge. Jacquelynn Cree RN, BSN, CCM

## 2012-09-04 NOTE — Discharge Summary (Signed)
Physician Discharge Summary  Joy Wright ZOX:[id number] DOB: [birth year] DOA: 09/01/2012  PCP: No primary provider on file.  Admit date: 09/01/2012 Discharge date: 09/04/2012  Time spent: 40 minutes  Recommendations for Outpatient Follow-up:  1. Please followup with her primary care provider in one week  Discharge Diagnoses:  Principal Problem:   Weakness of left arm Active Problems:   Numbness and tingling  Discharge Condition: Stable  Diet recommendation: Regular diet  Filed Weights   09/01/12 1520 09/01/12 1916  Weight: 63.504 kg (140 lb) 57.5 kg (126 lb 12.2 oz)    History of present illness:  Patient is a 32 year old female with past medical history of pulmonary embolism presented to [facility] with chief complaints of inability to walk due to left sided weakness and paralysis. Patient has been having episodes of numbness and weakness in her extremities since January 2013. She had a similar spell of symptoms between January and March of 2013. Numbness and tingling typically starts in the hands and feet. Symptoms progressed and weakness involving left upper extremity and left lower extremity primarily and to a lesser extent right lower extremity. There is no clear history of urinary urgency. She has not had urinary incontinence. She's had no symptoms involving speech and no facial weakness or numbness. Patient denies any fever, chills or any recent viral illnesses. No changes in bowel movements have been noted. Patient has not had any recent changes in her medications. CT scan of her head today without contrast which was normal. She's had no injury to her neck.  Hospital Course:  Her left-sided weakness has been thoroughly investigated while patient hospitalized. Neurology has been consulted and they recommended MRI of the brain and of the C-spine. The imaging was negative for any acute infarction, or for any demyelinating processes DOS neurology signed off. Psychiatry has  been also consulted to rule out conversion disorder and appreciated that the patient may benefit from outpatient  counseling services. Physical therapy was consulted and recommended home health PT which was set up on discharge. Advised patient to follow up with her PCP in 1 week for re-evaluation whether she can return to work at that time.   Procedures:  none   Consultations:  Neurology  Psychiatry   Discharge Exam: Filed Vitals:   09/03/12 1012 09/03/12 1414 09/03/12 2135 09/04/12 0518  BP: 116/76 107/69 125/87 105/73  Pulse: 76 95 76 71  Temp: 98.6 F (37 C) 98.3 F (36.8 C) 97.9 F (36.6 C) 97.5 F (36.4 C)  TempSrc: Oral Oral Oral Oral  Resp: 18 18 20 16   Height:      Weight:      SpO2: 100% 100% 100% 100%   General: NAD Cardiovascular: RRR without MRG Respiratory: CTA biL  Discharge Instructions    Medication List     As of 09/04/2012  3:48 PM    Notice      You have not been prescribed any medications.         The results of significant diagnostics from this hospitalization (including imaging, microbiology, ancillary and laboratory) are listed below for reference.    Significant Diagnostic Studies: Ct Head Wo Contrast  09/01/2012  *RADIOLOGY REPORT*  Clinical Data: Chronic left-sided weakness  CT HEAD WITHOUT CONTRAST  Technique:  Contiguous axial images were obtained from the base of the skull through the vertex without contrast.  Comparison: None.  Findings: No evidence of parenchymal hemorrhage or extra-axial fluid collection. No mass lesion, mass effect, or midline shift.  No CT evidence of acute infarction.  Cerebral volume is age appropriate.  No ventriculomegaly.  The visualized paranasal sinuses are essentially clear. The mastoid air cells are unopacified.  No evidence of calvarial fracture.  IMPRESSION: Normal head CT.   Original Report Authenticated By: Charline Bills, M.D.    Mr Brain Wo Contrast  09/02/2012  *RADIOLOGY REPORT*  Clinical Data:   32 year old female with history of pulmonary embolism.  Left side weakness, paralysis.  Episodes of numbness and weakness since January.  No known injury.  The patient declined the postcontrast portion of the exams as had been initially planned.  MRI HEAD WITHOUT CONTRAST MRI CERVICAL SPINE WITHOUT CONTRAST  Technique:  Multiplanar, multiecho pulse sequences of the brain and surrounding structures, and cervical spine, to include the craniocervical junction and cervicothoracic junction, were obtained without intravenous contrast.  Comparison:  Head CT without contrast 09/01/2012.  MRI HEAD  Findings:  Moderate wrap artifact affecting diffusion weighted imaging.  Artifact in the medial right thalamus.  No definite No restricted diffusion to suggest acute infarction.  Normal cerebral volume. Major intracranial vascular flow voids are preserved. No midline shift, ventriculomegaly, mass effect, evidence of mass lesion, extra-axial collection or acute intracranial hemorrhage.  Cervicomedullary junction and pituitary are within normal limits.  Wallace Cullens and white matter signal is within normal limits throughout the brain.  Normal bone marrow signal.  Visualized orbit soft tissues are within normal limits.  Minor paranasal sinus mucosal thickening.  Small nasopharyngeal retention cyst.  Mastoids are clear.  Grossly normal visualized internal auditory structures.  Negative scalp soft tissues.  IMPRESSION: 1. Normal noncontrast MRI appearance of the brain. 2.  Cervical spine findings are below.  MRI CERVICAL SPINE  Findings: Straightening of cervical lordosis. No marrow edema or evidence of acute osseous abnormality.  4-5 mm T2 hyperintense right thyroid nodule is too small to characterize and most likely benign in the absence of known risk factors for thyroid carcinoma.  Other visible thyroid parenchyma is within normal limits.  Other visualized paraspinal soft tissues are within normal limits.  C2-C3:  Negative.  C3-C4:   Negative.  C4-C5:  Negative.  C5-C6:  Negative.  C6-C7:  Negative.  C7-T1:  Negative. Negative visualized upper thoracic levels.  Spinal cord signal is within normal limits at all visualized levels.  IMPRESSION: Normal MRI appearance of the cervical spine.   Original Report Authenticated By: Erskine Speed, M.D.    Mr Laqueta Jean Contrast  09/02/2012  *RADIOLOGY REPORT*  Clinical Data:  32 year old female with history of left side weakness, paralysis, episodes of numbness and weakness.  Returns for postcontrast imaging, which the patient had declined earlier.  Contrast:  12 ml MultiHance.  Comparison:  Noncontrast brain and cervical MRI from the same day reported separately.  MRI HEAD WITH CONTRAST MRI CERVICAL SPINE WITH CONTRAST  Technique: Post contrast only MR imaging of the brain and surrounding structures, and cervical spine, to include the craniocervical junction and cervicothoracic junction.  MRI HEAD  Findings:  No abnormal enhancement identified.  Stable and normal cerebral morphology as described on the earlier noncontrast study. 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  IMPRESSION: 1.  Normal post contrast MRI appearance of the brain. 2.  Cervical postcontrast findings are below.  MRI CERVICAL SPINE  Findings: Precontrast axial T1-weighted images also were obtained for comparison.  No abnormal intradural enhancement.  No abnormal no abnormal enhancement of the visualized osseous structures  or paraspinal soft tissues.  Of note, the previously described small right thyroid nodule does not enhance.  IMPRESSION: Normal post contrast MRI appearance of the cervical spine.   Original Report Authenticated By: Erskine Speed, M.D.    Mr Cervical Spine Wo Contrast  09/02/2012  *RADIOLOGY REPORT*  Clinical Data:  32 year old female with history of pulmonary embolism.  Left side weakness, paralysis.  Episodes of numbness  and weakness since January.  No known injury.  The patient declined the postcontrast portion of the exams as had been initially planned.  MRI HEAD WITHOUT CONTRAST MRI CERVICAL SPINE WITHOUT CONTRAST  Technique:  Multiplanar, multiecho pulse sequences of the brain and surrounding structures, and cervical spine, to include the craniocervical junction and cervicothoracic junction, were obtained without intravenous contrast.  Comparison:  Head CT without contrast 09/01/2012.  MRI HEAD  Findings:  Moderate wrap artifact affecting diffusion weighted imaging.  Artifact in the medial right thalamus.  No definite No restricted diffusion to suggest acute infarction.  Normal cerebral volume. Major intracranial vascular flow voids are preserved. No midline shift, ventriculomegaly, mass effect, evidence of mass lesion, extra-axial collection or acute intracranial hemorrhage.  Cervicomedullary junction and pituitary are within normal limits.  Wallace Cullens and white matter signal is within normal limits throughout the brain.  Normal bone marrow signal.  Visualized orbit soft tissues are within normal limits.  Minor paranasal sinus mucosal thickening.  Small nasopharyngeal retention cyst.  Mastoids are clear.  Grossly normal visualized internal auditory structures.  Negative scalp soft tissues.  IMPRESSION: 1. Normal noncontrast MRI appearance of the brain. 2.  Cervical spine findings are below.  MRI CERVICAL SPINE  Findings: Straightening of cervical lordosis. No marrow edema or evidence of acute osseous abnormality.  4-5 mm T2 hyperintense right thyroid nodule is too small to characterize and most likely benign in the absence of known risk factors for thyroid carcinoma.  Other visible thyroid parenchyma is within normal limits.  Other visualized paraspinal soft tissues are within normal limits.  C2-C3:  Negative.  C3-C4:  Negative.  C4-C5:  Negative.  C5-C6:  Negative.  C6-C7:  Negative.  C7-T1:  Negative. Negative visualized upper  thoracic levels.  Spinal cord signal is within normal limits at all visualized levels.  IMPRESSION: Normal MRI appearance of the cervical spine.   Original Report Authenticated By: Erskine Speed, M.D.    Mr Cervical Spine W Contrast  09/02/2012  *RADIOLOGY REPORT*  Clinical Data:  32 year old female with history of left side weakness, paralysis, episodes of numbness and weakness.  Returns for postcontrast imaging, which the patient had declined earlier.  Contrast:  12 ml MultiHance.  Comparison:  Noncontrast brain and cervical MRI from the same day reported separately.  MRI HEAD WITH CONTRAST MRI CERVICAL SPINE WITH CONTRAST  Technique: Post contrast only MR imaging of the brain and surrounding structures, and cervical spine, to include the craniocervical junction and cervicothoracic junction.  MRI HEAD  Findings:  No abnormal enhancement identified.  Stable and normal cerebral morphology as described on the earlier noncontrast study. 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  IMPRESSION: 1.  Normal post contrast MRI appearance of the brain. 2.  Cervical postcontrast findings are below.  MRI CERVICAL SPINE  Findings: Precontrast axial T1-weighted images also were obtained for comparison.  No abnormal intradural enhancement.  No abnormal no abnormal enhancement of the visualized osseous structures or paraspinal soft tissues.  Of note, the previously  described small right thyroid nodule does not enhance.  IMPRESSION: Normal post contrast MRI appearance of the cervical spine.   Original Report Authenticated By: Erskine Speed, M.D.    Labs: Basic Metabolic Panel:  Recent Labs Lab 09/01/12 1550  NA 137  K 4.0  CL 105  CO2 24  GLUCOSE 91  BUN 8  CREATININE 0.70  CALCIUM 9.1   Liver Function Tests:  Recent Labs Lab 09/01/12 1550  AST 15  ALT 9  ALKPHOS 43  BILITOT 0.2*  PROT 7.5  ALBUMIN 3.9    CBC:  Recent Labs Lab 09/01/12 1550  WBC 4.3  NEUTROABS 1.6*  HGB 11.9*  HCT 35.0*  MCV 90.2  PLT 188     Signed:  GHERGHE, COSTIN  [facility] 09/04/2012, 3:48 PM

## 2012-09-04 NOTE — Progress Notes (Addendum)
Physical Therapy Treatment Patient Details Name: Joy Wright MRN: [id number] DOB: [birth year] Today's Date: 09/04/2012 Time: 7829-5621 PT Time Calculation (min): 38 min  PT Assessment / Plan / Recommendation Comments on Treatment Session  Pt. responds well to specific directions on how to activate movement in left extremities.  She is not able to "lift" left arm or leg but there are signs of muscle activity in functional tasks.  PT. NEEDS AND OT CONSULT .    Follow Up Recommendations  Home health PT;Supervision/Assistance - 24 hour;Supervision for mobility/OOB;Other (comment) (CIR denied pt.  She needs 24 hour care at home vs SNF rehab)     Does the patient have the potential to tolerate intense rehabilitation     Barriers to Discharge        Equipment Recommendations  Rolling walker with 5" wheels;Wheelchair (measurements PT);Wheelchair cushion (measurements PT);Other (comment) (if DC's home)    Recommendations for Other Services    Frequency Min 4X/week   Plan Discharge plan needs to be updated    Precautions / Restrictions Precautions Precautions: Fall Restrictions Weight Bearing Restrictions: No   Pertinent Vitals/Pain No distress, no pain    Mobility  Bed Mobility Bed Mobility: Rolling Left;Left Sidelying to Sit;Sitting - Scoot to Edge of Bed Rolling Left: 5: Supervision Left Sidelying to Sit: 4: Min assist Sitting - Scoot to Edge of Bed: 5: Supervision Details for Bed Mobility Assistance: assist needed to elevate shouilders to sitting position Transfers Transfers: Sit to Stand;Stand to Sit Sit to Stand: 4: Min assist;With upper extremity assist;From bed;Other (comment) (from sofa in room) Stand to Sit: 4: Min assist;With upper extremity assist;To chair/3-in-1;Other (comment) (to sofa in room) Ambulation/Gait Ambulation/Gait Assistance: 3: Mod assist Ambulation Distance (Feet): 10 Feet Assistive device: 1 person hand held assist;Rolling walker;Other (Comment) (shelf  arm assist) Ambulation/Gait Assistance Details: Pt. demonstrrates difficulty in advancing left LE but responds well to some physical assist as well as lots of encouragement in leading with the knee.  SHe keeps her left knee bent in stance phase but does not buckle the knee.  There is underlying motor activity present to sustain her upright.  She is able to keep the left arm on therapist's arm in shelf arm support postition without the arm falling down ward.   Gait Pattern: Decreased step length - right;Decreased step length - left;Decreased hip/knee flexion - left;Decreased dorsiflexion - left;Decreased weight shift to left;Trunk flexed Gait velocity: significantly slowed Stairs: No Wheelchair Mobility Wheelchair Mobility: No Modified Rankin (Stroke Patients Only) Pre-Morbid Rankin Score: No significant disability Modified Rankin: Severe disability    Exercises     PT Diagnosis:    PT Problem List:   PT Treatment Interventions:     PT Goals Acute Rehab PT Goals Time For Goal Achievement: 09/16/12 Potential to Achieve Goals: Good Pt will go Supine/Side to Sit: with supervision PT Goal: Supine/Side to Sit - Progress: Updated due to goal met Pt will go Sit to Stand: with supervision PT Goal: Sit to Stand - Progress: Updated due to goal met Pt will go Stand to Sit: with supervision PT Goal: Stand to Sit - Progress: Updated due to goals met Pt will Ambulate: with +2 total assist;with least restrictive assistive device;1 - 15 feet;Other (comment) PT Goal: Ambulate - Progress: Progressing toward goal  Visit Information  Last PT Received On: 09/04/12 Assistance Needed: +1    Subjective Data  Subjective: "I'm feeling something in my left knee"  referring to muscle activity.   Cognition  Cognition Overall  Cognitive Status: Appears within functional limits for tasks assessed/performed Arousal/Alertness: Awake/alert Orientation Level: Oriented X4 / Intact Behavior During Session: [facility]  for tasks performed    Balance     End of Session PT - End of Session Equipment Utilized During Treatment: Gait belt Activity Tolerance: Patient tolerated treatment well Patient left: in chair;with call bell/phone within reach Nurse Communication: Mobility status   GP     Ferman Hamming 09/04/2012, 10:36 AM Weldon Picking PT Acute Rehab Services [phone] Beeper [phone]

## 2013-07-07 ENCOUNTER — Emergency Department (HOSPITAL_BASED_OUTPATIENT_CLINIC_OR_DEPARTMENT_OTHER)
Admission: EM | Admit: 2013-07-07 | Discharge: 2013-07-07 | Disposition: A | Discharge status: Home / Self Care | Payer: PRIVATE HEALTH INSURANCE | Attending: Emergency Medicine | Admitting: Emergency Medicine

## 2013-07-07 ENCOUNTER — Encounter (HOSPITAL_BASED_OUTPATIENT_CLINIC_OR_DEPARTMENT_OTHER): Payer: Self-pay | Admitting: Emergency Medicine

## 2013-07-07 DIAGNOSIS — Z86711 Personal history of pulmonary embolism: Secondary | ICD-10-CM | POA: Insufficient documentation

## 2013-07-07 DIAGNOSIS — J069 Acute upper respiratory infection, unspecified: Secondary | ICD-10-CM | POA: Insufficient documentation

## 2013-07-07 LAB — RAPID STREP SCREEN (MED CTR MEBANE ONLY): Streptococcus, Group A Screen (Direct): NEGATIVE

## 2013-07-07 NOTE — Discharge Instructions (Signed)

## 2013-07-07 NOTE — ED Notes (Signed)
Pt c/o headache, sinus pressure, cough and sore throat x 1 day.

## 2013-07-07 NOTE — ED Provider Notes (Signed)
CSN: [id number]     Arrival date & time 07/07/13  0745 History   First MD Initiated Contact with Patient 07/07/13 [phone]     Chief Complaint  Patient presents with  . URI   (Consider location/radiation/quality/duration/timing/severity/associated sxs/prior Treatment) Patient is a 33 y.o. female presenting with URI.  URI  Pt with 2-3 days of mild headache, sinus congestion, runny nose, sore throat and productive cough. No fever. Pt has been around sick people at work.   Past Medical History  Diagnosis Date  . PE (pulmonary embolism)    Past Surgical History  Procedure Laterality Date  . Tubal ligation     No family history on file. History  Substance Use Topics  . Smoking status: Never Smoker   . Smokeless tobacco: Not on file  . Alcohol Use: Yes   OB History   Grav Para Term Preterm Abortions TAB SAB Ect Mult Living                 Review of Systems All other systems reviewed and are negative except as noted in HPI.   Allergies  Review of patient's allergies indicates no known allergies.  Home Medications  No current outpatient prescriptions on file. BP 123/79  Pulse 66  Temp(Src) 98.4 F (36.9 C) (Oral)  Resp 16  SpO2 98%  LMP 06/23/2013 Physical Exam  Nursing note and vitals reviewed. Constitutional: She is oriented to person, place, and time. She appears well-developed and well-nourished.  HENT:  Head: Normocephalic and atraumatic.  Hoarse voice, mild pharyngeal erythema, no tonsillar swelling or exudate  Eyes: EOM are normal. Pupils are equal, round, and reactive to light.  Neck: Normal range of motion. Neck supple.  Cardiovascular: Normal rate, normal heart sounds and intact distal pulses.   Pulmonary/Chest: Effort normal and breath sounds normal.  Abdominal: Bowel sounds are normal. She exhibits no distension. There is no tenderness.  Musculoskeletal: Normal range of motion. She exhibits no edema and no tenderness.  Neurological: She is alert and  oriented to person, place, and time. She has normal strength. No cranial nerve deficit or sensory deficit.  Skin: Skin is warm and dry. No rash noted.  Psychiatric: She has a normal mood and affect.    ED Course  Procedures (including critical care time) Labs Review Labs Reviewed  RAPID STREP SCREEN  CULTURE, GROUP A STREP   Imaging Review No results found.  EKG Interpretation   None       MDM   1. Viral URI     Strep neg, likely viral URI. Advised symptomatic care at home.     Charles B. Bernette MayersSheldon, MD 07/07/13 [id number]

## 2013-07-09 LAB — CULTURE, GROUP A STREP

## 2014-07-07 IMAGING — CT CT HEAD W/O CM
1 series · 16 of 30 positions shown, 20 images · non-contrast
Comparison: None.

CLINICAL DATA: Chronic left-sided weakness

CT HEAD WITHOUT CONTRAST
TECHNIQUE: Contiguous axial images were obtained from the base of
the skull through the vertex without contrast.

[Series 2: head 4.8 h37s · axial · 0.40mm/px · z∈[+1306,+1439]mm · 16 of 32 slices shown, 20 images]
[im 2/32  brain]
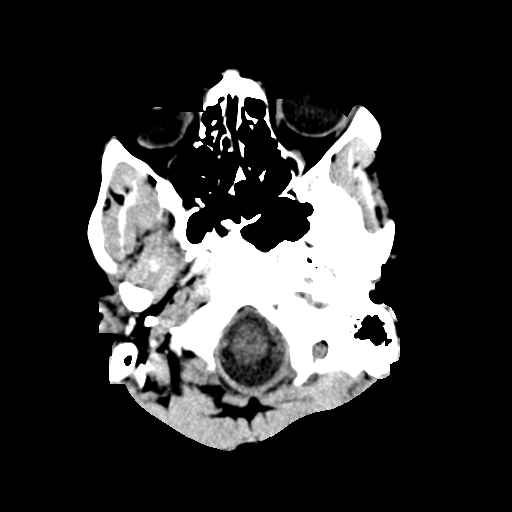
[im 2/32  bone]
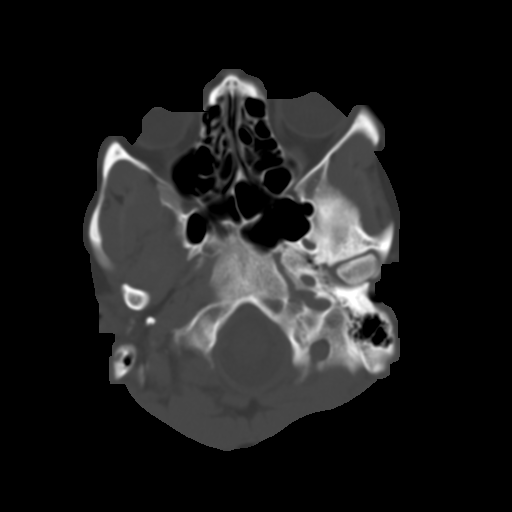
[im 4/32  brain]
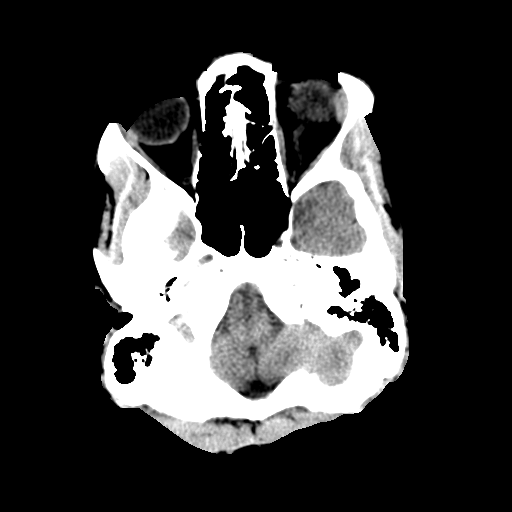
[im 6/32  brain]
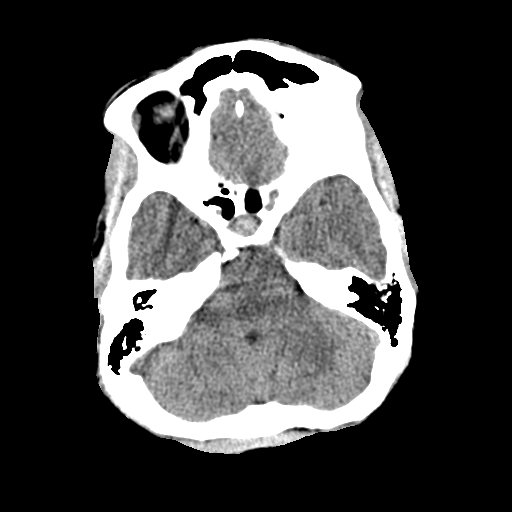
[im 8/32  brain]
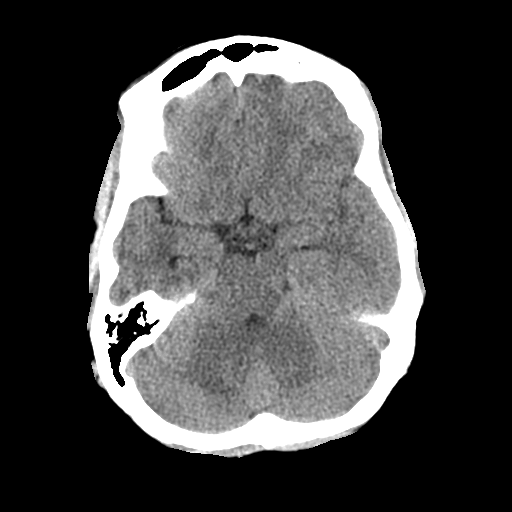
[im 9/32  brain]
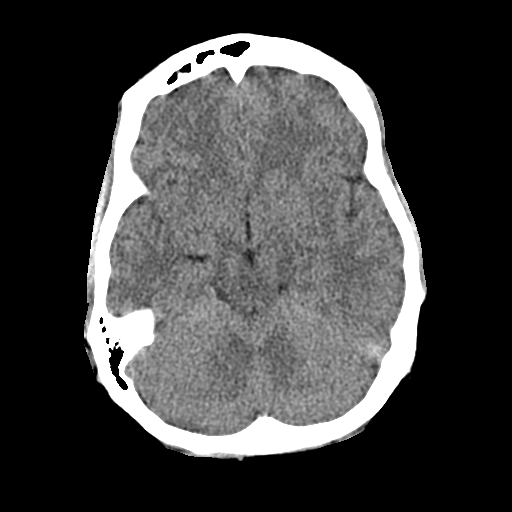
[im 9/32  bone]
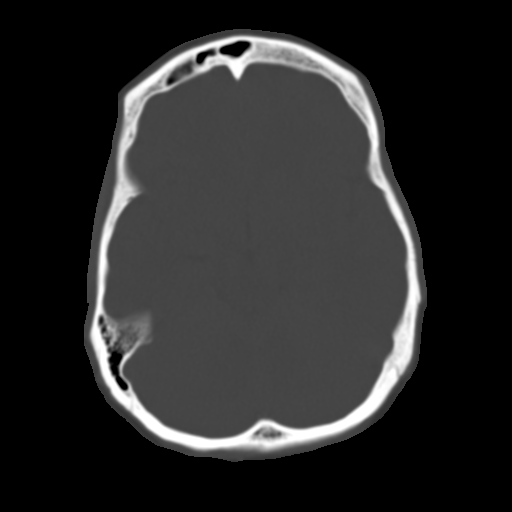
[im 11/32  brain]
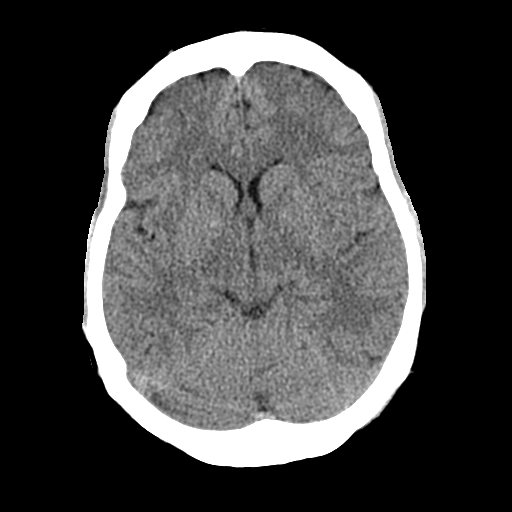
[im 13/32  brain]
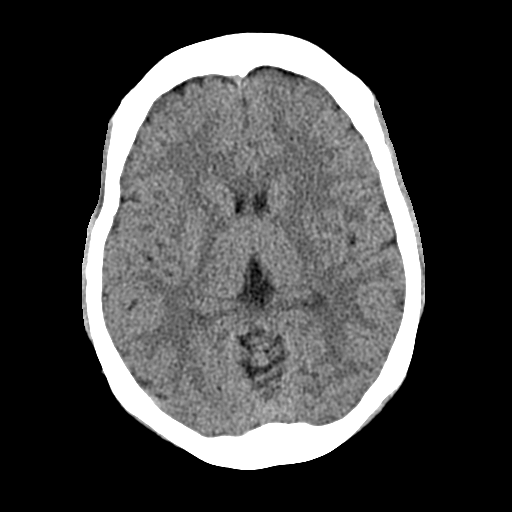
[im 15/32  brain]
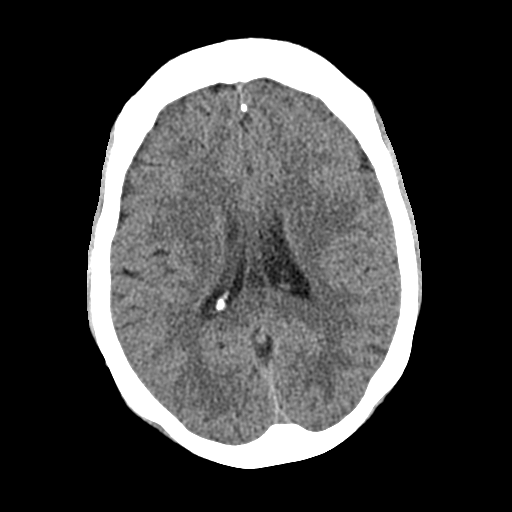
[im 17/32  brain]
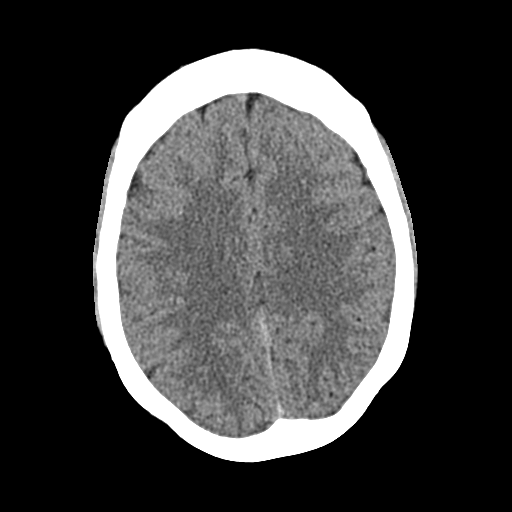
[im 17/32  bone]
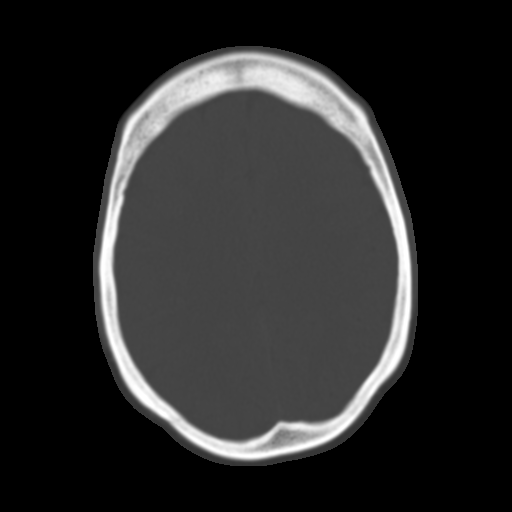
[im 19/32  brain]
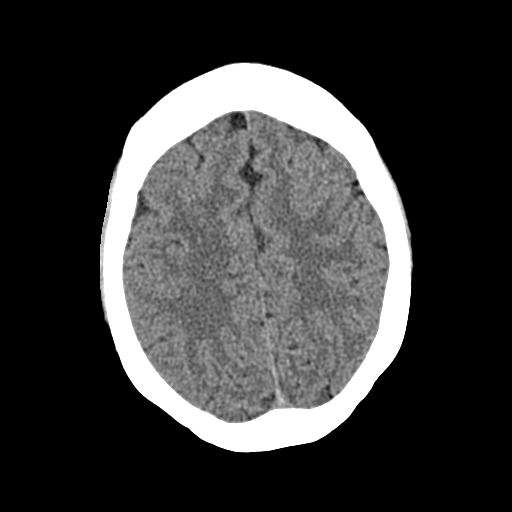
[im 21/32  brain]
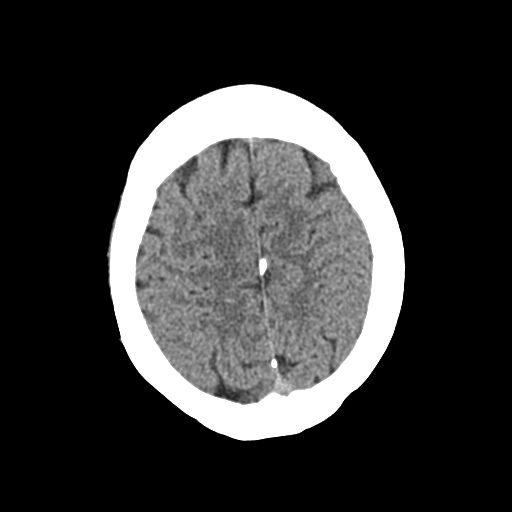
[im 23/32  brain]
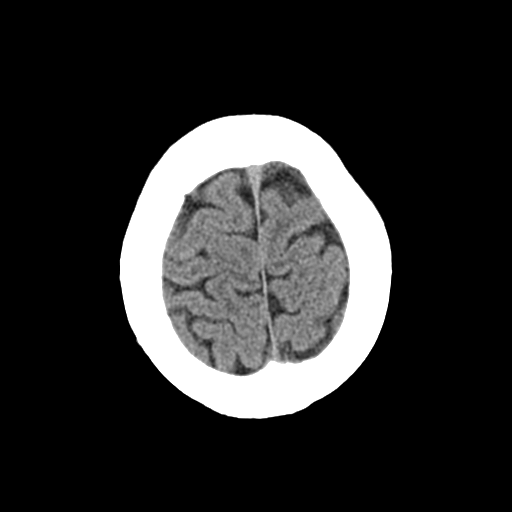
[im 24/32  brain]
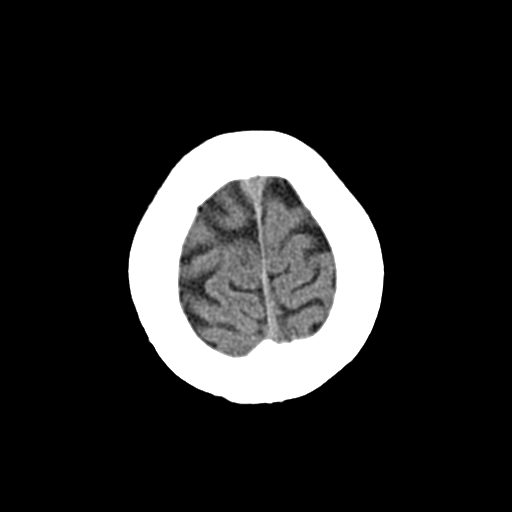
[im 24/32  bone]
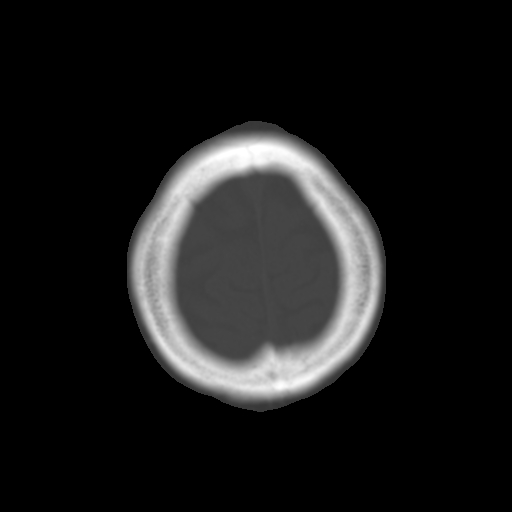
[im 26/32  brain]
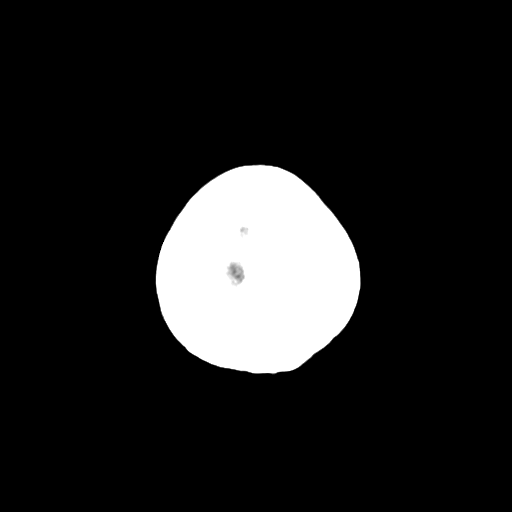
[im 28/32  brain]
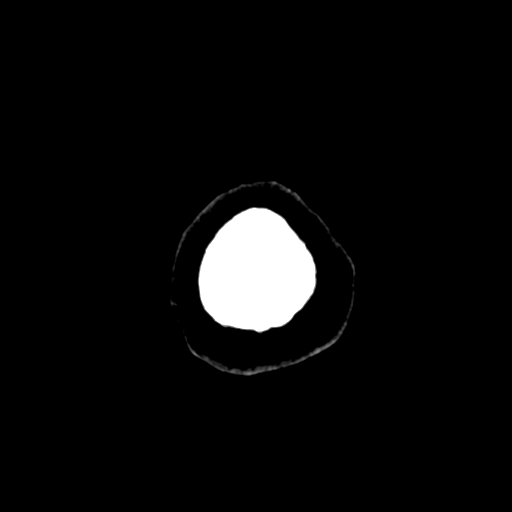
[im 30/32  brain]
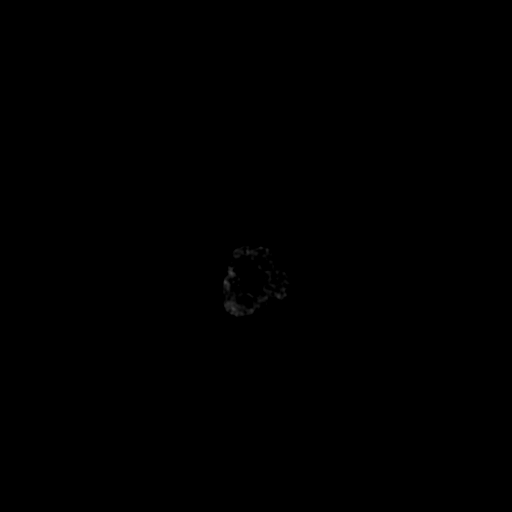

[16 of 30 positions shown; findings below may reference images not displayed]

FINDINGS: No evidence of parenchymal hemorrhage or extra-axial
fluid collection. No mass lesion, mass effect, or midline shift.

No CT evidence of acute infarction.

Cerebral volume is age appropriate.  No ventriculomegaly.

The visualized paranasal sinuses are essentially clear. The mastoid
air cells are unopacified.

No evidence of calvarial fracture.
IMPRESSION: Normal head CT.

## 2014-09-29 ENCOUNTER — Emergency Department (HOSPITAL_BASED_OUTPATIENT_CLINIC_OR_DEPARTMENT_OTHER)
Admission: EM | Admit: 2014-09-29 | Discharge: 2014-09-29 | Disposition: A | Discharge status: Home / Self Care | Payer: PRIVATE HEALTH INSURANCE | Attending: Emergency Medicine | Admitting: Emergency Medicine

## 2014-09-29 ENCOUNTER — Encounter (HOSPITAL_BASED_OUTPATIENT_CLINIC_OR_DEPARTMENT_OTHER): Payer: Self-pay | Admitting: *Deleted

## 2014-09-29 DIAGNOSIS — Z8632 Personal history of gestational diabetes: Secondary | ICD-10-CM | POA: Insufficient documentation

## 2014-09-29 DIAGNOSIS — M25562 Pain in left knee: Secondary | ICD-10-CM | POA: Insufficient documentation

## 2014-09-29 DIAGNOSIS — M79646 Pain in unspecified finger(s): Secondary | ICD-10-CM | POA: Insufficient documentation

## 2014-09-29 DIAGNOSIS — Z86711 Personal history of pulmonary embolism: Secondary | ICD-10-CM | POA: Insufficient documentation

## 2014-09-29 DIAGNOSIS — M2550 Pain in unspecified joint: Secondary | ICD-10-CM

## 2014-09-29 DIAGNOSIS — M25532 Pain in left wrist: Secondary | ICD-10-CM | POA: Insufficient documentation

## 2014-09-29 DIAGNOSIS — M25579 Pain in unspecified ankle and joints of unspecified foot: Secondary | ICD-10-CM | POA: Insufficient documentation

## 2014-09-29 DIAGNOSIS — Z3202 Encounter for pregnancy test, result negative: Secondary | ICD-10-CM | POA: Insufficient documentation

## 2014-09-29 HISTORY — DX: Gestational diabetes mellitus in pregnancy, unspecified control: O24.419

## 2014-09-29 LAB — BASIC METABOLIC PANEL
Anion gap: 5 (ref 5–15)
BUN: 12 mg/dL (ref 6–23)
CALCIUM: 8.7 mg/dL (ref 8.4–10.5)
CO2: 26 mmol/L (ref 19–32)
CREATININE: 0.78 mg/dL (ref 0.50–1.10)
Chloride: 107 mmol/L (ref 96–112)
GFR calc Af Amer: >90 mL/min (ref >90)
GLUCOSE: 83 mg/dL (ref 70–99)
Potassium: 3.8 mmol/L (ref 3.5–5.1)
Sodium: 138 mmol/L (ref 135–145)

## 2014-09-29 LAB — TSH: TSH: 1.033 u[IU]/mL (ref 0.350–4.500)

## 2014-09-29 LAB — PREGNANCY, URINE: Preg Test, Ur: NEGATIVE

## 2014-09-29 MED ORDER — NAPROXEN 500 MG PO TABS: 500.0000 mg | ORAL_TABLET | Freq: Two times a day (BID) | ORAL | Status: AC

## 2014-09-29 NOTE — Discharge Instructions (Signed)

## 2014-09-29 NOTE — ED Provider Notes (Signed)
CSN: [id number]     Arrival date & time 09/29/14  [id number] History   First MD Initiated Contact with Patient 09/29/14 [phone]     Chief Complaint  Patient presents with  . Joint Swelling     (Consider location/radiation/quality/duration/timing/severity/associated sxs/prior Treatment) HPI Comments: Patient presenting today with intermittent joint pain over the last 2 years that seems to be worsening. She gets left-sided knee pain, ankle pain and left-sided wrist and finger pain. Occasionally she will get right wrist and finger pain as well as right knee pain. Intermittently her joints will swell and become red. She seems to have worse symptoms in the morning that improves as the day goes on. 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 She states that the pain does not seem to be worse with activity. She currently is taking nothing for this. She has never been worked up for this but states 2 years ago was seen for left-sided weakness. At that time she was hospitalized with normal MRI of the brain and cervical spine. At that time possible conversion order was discussed. Because she does not have insurance she has not seen a doctor for the joint pain and swelling. She had a pulmonary embolism several years ago but is not currently taking anticoagulation  The history is provided by the patient.    Past Medical History  Diagnosis Date  . PE (pulmonary embolism)   . Gestational diabetes    Past Surgical History  Procedure Laterality Date  . Tubal ligation     No family history on file. History  Substance Use Topics  . Smoking status: Never Smoker   . Smokeless tobacco: Never Used  . Alcohol Use: 0.6 oz/week    1 Glasses of wine per week     Comment: 2x month   OB History    No data available     Review of Systems  Genitourinary:       Last LMP was March 9 has not had a period in April  Musculoskeletal: Positive for myalgias and  joint swelling.  All other systems reviewed and are negative.     Allergies  Review of patient's allergies indicates no known allergies.  Home Medications   Prior to Admission medications   Not on File   BP 127/94 mmHg  Pulse 80  Temp(Src) 98.6 F (37 C) (Oral)  Resp 20  Ht 5\' 2"  (1.575 m)  Wt 151 lb (68.493 kg)  BMI 27.61 kg/m2  SpO2 100%  LMP 08/17/2014 Physical Exam  Constitutional: She is oriented to person, place, and time. She appears well-developed and well-nourished. No distress.  HENT:  Head: Normocephalic and atraumatic.  Mouth/Throat: Oropharynx is clear and moist.  Eyes: Conjunctivae and EOM are normal. Pupils are equal, round, and reactive to light.  Neck: Normal range of motion. Neck supple. Carotid bruit is not present. No thyroid mass and no thyromegaly present.  Cardiovascular: Normal rate, regular rhythm and intact distal pulses.   No murmur heard. Pulmonary/Chest: Effort normal and breath sounds normal. No respiratory distress. She has no wheezes. She has no rales.  Abdominal: Soft. She exhibits no distension. There is no tenderness. There is no rebound and no guarding.  Musculoskeletal: Normal range of motion.  Mild swelling in the left second and third MCP and PIP joint. Tenderness with palpation to the left wrist and range of motion. No noted swelling or tenderness in the  right hand or wrist. Mild tenderness to the popliteal fossa of the left knee without notable swelling. Patient ambulates but has a slight limp  Neurological: She is alert and oriented to person, place, and time.  Skin: Skin is warm and dry. No rash noted. No erythema.  Psychiatric: She has a normal mood and affect. Her behavior is normal.  Nursing note and vitals reviewed.   ED Course  Procedures (including critical care time) Labs Review Labs Reviewed  PREGNANCY, URINE  BASIC METABOLIC PANEL  TSH    Imaging Review No results found.   EKG Interpretation None      MDM    Final diagnoses:  Joint pain    Patient presents complaining of multiple issues most recently of intermittent joint pain and swelling. It seems to be more pronounced in the left fingers, wrists and left knee however it also moves into her right wrist and fingers. Is worse in the morning and she gets intermittent swelling. Patient states this is been intermittent for the last 2 years. It does not seem to be affected whether she is working or at rest. She states she recently started a new job but had been off work for the last 3 months and the pain is not changed. Patient of note was hospitalized in 2014 for left-sided weakness and inability to walk that time was thought to possibly have conversion disorder. Since that time she has not had a physician but continues to have the joint pain and intermittent swelling. She denies any prior history of rheumatoid arthritis, lupus or other autoimmune disease. She has no family history of similar. She also complains of the temp and weight gain in the last 1 year and states she's never had her thyroid checked. Discussed with patient follow-[facility] and potentially rheumatology follow-up from there. BMP, TSH and urine pregnancy all wnl.  [phone]    Gwyneth Sprout, MD 09/30/14 1657

## 2014-09-29 NOTE — ED Notes (Addendum)
Pt reports 2 years of left side pain and knee swelling- has been evaluated multiple times for same without diagnosis- states "i was paralyzed on my left side 2 years ago"- today her left hand started swelling which was new and was directed by her employer to come to ED- pt ambulates without difficulty

## 2014-09-29 NOTE — ED Notes (Signed)
D/c home with rx x 1 for naproxen- follow up care discussed

## 2016-11-23 ENCOUNTER — Encounter (HOSPITAL_BASED_OUTPATIENT_CLINIC_OR_DEPARTMENT_OTHER): Payer: Self-pay | Admitting: *Deleted

## 2016-11-23 ENCOUNTER — Other Ambulatory Visit: Payer: Self-pay | Admitting: Emergency Medicine

## 2016-11-23 ENCOUNTER — Emergency Department (HOSPITAL_BASED_OUTPATIENT_CLINIC_OR_DEPARTMENT_OTHER): Payer: PRIVATE HEALTH INSURANCE

## 2016-11-23 ENCOUNTER — Emergency Department (HOSPITAL_BASED_OUTPATIENT_CLINIC_OR_DEPARTMENT_OTHER)
Admission: EM | Admit: 2016-11-23 | Discharge: 2016-11-23 | Disposition: A | Discharge status: Home / Self Care | Payer: PRIVATE HEALTH INSURANCE | Attending: Emergency Medicine | Admitting: Emergency Medicine

## 2016-11-23 DIAGNOSIS — R079 Chest pain, unspecified: Secondary | ICD-10-CM | POA: Insufficient documentation

## 2016-11-23 DIAGNOSIS — M25562 Pain in left knee: Secondary | ICD-10-CM | POA: Diagnosis present

## 2016-11-23 DIAGNOSIS — Z79899 Other long term (current) drug therapy: Secondary | ICD-10-CM | POA: Diagnosis not present

## 2016-11-23 DIAGNOSIS — M7989 Other specified soft tissue disorders: Secondary | ICD-10-CM

## 2016-11-23 DIAGNOSIS — R2242 Localized swelling, mass and lump, left lower limb: Secondary | ICD-10-CM | POA: Diagnosis not present

## 2016-11-23 LAB — D-DIMER, QUANTITATIVE: D-Dimer, Quant: <0.27 ug/mL-FEU (ref 0.00–0.50)

## 2016-11-23 LAB — BASIC METABOLIC PANEL
ANION GAP: 8 (ref 5–15)
BUN: 7 mg/dL (ref 6–20)
CALCIUM: 9.2 mg/dL (ref 8.9–10.3)
CO2: 27 mmol/L (ref 22–32)
CREATININE: 0.81 mg/dL (ref 0.44–1.00)
Chloride: 102 mmol/L (ref 101–111)
Glucose, Bld: 101 mg/dL — ABNORMAL HIGH (ref 65–99)
Potassium: 3.2 mmol/L — ABNORMAL LOW (ref 3.5–5.1)
SODIUM: 137 mmol/L (ref 135–145)

## 2016-11-23 LAB — CBC
HCT: 38.4 % (ref 36.0–46.0)
Hemoglobin: 13 g/dL (ref 12.0–15.0)
MCH: 30.2 pg (ref 26.0–34.0)
MCHC: 33.9 g/dL (ref 30.0–36.0)
MCV: 89.1 fL (ref 78.0–100.0)
PLATELETS: 237 10*3/uL (ref 150–400)
RBC: 4.31 MIL/uL (ref 3.87–5.11)
RDW: 12.1 % (ref 11.5–15.5)
WBC: 6.3 10*3/uL (ref 4.0–10.5)

## 2016-11-23 NOTE — ED Triage Notes (Signed)
Pt reports L knee pain since yesterday; bruise noted to area. No swelling noted to area at this time; denies known injury. Reports sob earlier today that's resolved. Denies chest pain.

## 2016-11-23 NOTE — Discharge Instructions (Signed)
Your ultrasound in your workup for clots were negative. 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 It is helpful to use a knee sleeve, which can be purchased over-the-counter and any drug store. This may help improve some of the swelling you have. Otherwise, elevate the leg above your heart. At night when you're lying down. Follow up with her primary care physician as soon as possible. Return for any new or worsening symptoms including sudden onset shortness of breath, pain in her chest, coughing up blood, discoloration in the toes, severe pain in the leg.

## 2016-11-23 NOTE — ED Provider Notes (Signed)
[facility] Provider Note   CSN: [id number] Arrival date & time: 11/23/16  1625  By signing my name below, I, Modena JanskyAlbert Thayil, attest that this documentation has been prepared under the direction and in the presence of non-physician practitioner, Arthor CaptainAbigail Rayme Bui, PA-C. Electronically Signed: Modena JanskyAlbert Thayil, Scribe. 11/23/2016. 6:47 PM.  History   Chief Complaint Chief Complaint  Patient presents with  . Knee Pain   The history is provided by the patient. No language interpreter was used.   HPI Comments: Joy Wright is a 36 y.o. female with a PMHx of PE who presents to the Emergency Department complaining of constant moderate LLE swelling that started yesterday. No modifying factors. She reports associated chest pain and left knee pain. She states she had no knee pain or DVT with her prior PE. Denies any chance of pregnancy, hx of smoking, estrogen hormone therapy, or other complaints at this time.   Past Medical History:  Diagnosis Date  . Gestational diabetes   . PE (pulmonary embolism)     Patient Active Problem List   Diagnosis Date Noted  . Weakness of left arm 09/01/2012  . Numbness and tingling 09/01/2012    Past Surgical History:  Procedure Laterality Date  . TUBAL LIGATION      OB History    No data available       Home Medications    Prior to Admission medications   Medication Sig Start Date End Date Taking? Authorizing Provider  Cyanocobalamin (VITAMIN B-12 CR PO) Take by mouth.   Yes [provider]  Methocarbamol (ROBAXIN PO) Take by mouth.   Yes [provider]  naproxen (NAPROSYN) 500 MG tablet Take 1 tablet (500 mg total) by mouth 2 (two) times daily. 09/29/14  Yes Gwyneth SproutPlunkett, Whitney, MD    Family History No family history on file.  Social History Social History  Substance Use Topics  . Smoking status: Never Smoker  . Smokeless tobacco: Never Used  . Alcohol use 0.6 oz/week    1 Glasses of wine per week     Comment:  2x month     Allergies   Patient has no known allergies.   Review of Systems Review of Systems  Cardiovascular: Positive for chest pain.  Musculoskeletal: Positive for myalgias (Left knee).     Physical Exam Updated Vital Signs BP 123/77 (BP Location: Right Arm)   Pulse 80   Temp 98.9 F (37.2 C) (Oral)   Resp 14   Ht 5\' 2"  (1.575 m)   Wt 170 lb (77.1 kg)   LMP 11/16/2016 (Approximate)   SpO2 98%   BMI 31.09 kg/m   Physical Exam  Constitutional: She appears well-developed and well-nourished. No distress.  HENT:  Head: Normocephalic.  Eyes: Conjunctivae are normal.  Neck: Neck supple.  Cardiovascular: Normal rate and regular rhythm.   Pulmonary/Chest: Effort normal.  Abdominal: Soft.  Musculoskeletal: Normal range of motion.  Mild swelling. The left lower extremity, slightly warmer, does not appear cellulitic, fullness in the popliteal fossa.  Neurological: She is alert.  Skin: Skin is warm and dry.  Psychiatric: She has a normal mood and affect.  Nursing note and vitals reviewed.    ED Treatments / Results  DIAGNOSTIC STUDIES: Oxygen Saturation is 98% on RA, normal by my interpretation.    COORDINATION OF CARE: 6:51 PM- Pt advised of plan for treatment and pt agrees.  Labs (all labs ordered are listed, but only abnormal results are displayed) Labs Reviewed - No data to  display  EKG  EKG Interpretation None       Radiology No results found.  Procedures Procedures (including critical care time)  Medications Ordered in ED Medications - No data to display   Initial Impression / Assessment and Plan / ED Course  I have reviewed the triage vital signs and the nursing notes.  Pertinent labs & imaging results that were available during my care of the patient were reviewed by me and considered in my medical decision making (see chart for details).      Patient DVT study negative for thrombus. Her d-dimer is also negative. 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 Patient is advised follow-up with her primary care physician Patient will be dc home & is agreeable with above plan.   Final Clinical Impressions(s) / ED Diagnoses   Final diagnoses:  Left leg swelling    New Prescriptions New Prescriptions   No medications on file    I personally performed the services described in this documentation, which was scribed in my presence. The recorded information has been reviewed and is accurate.       Arthor Captain, PA-C 11/27/16 1726    Nira Conn, MD 11/28/16 [phone]

## 2016-11-23 NOTE — ED Notes (Signed)
ED Provider at bedside. 

## 2016-11-23 NOTE — ED Notes (Signed)
Patient transported to Ultrasound 

## 2016-11-23 NOTE — ED Notes (Signed)
Assumed care of patient from MyrtleAdrienne, [place]. Pt resting quietly. Awaiting lab result, D Dimer in process. No distress.

## 2018-05-09 IMAGING — US US EXTREM LOW VENOUS*L*
1 series · 13 of 24 positions shown · non-contrast
Comparison: None.

CLINICAL DATA: 35 y/o  F; left lower extremity swelling.



[Series 1: us extrem low venous*left* · 0.05mm/px · 13 of 28 slices shown]
[im 1/28]
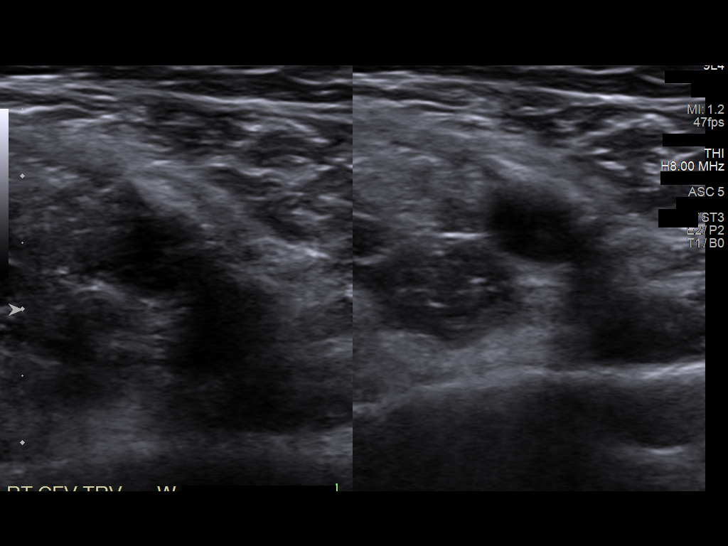
[im 3/28]
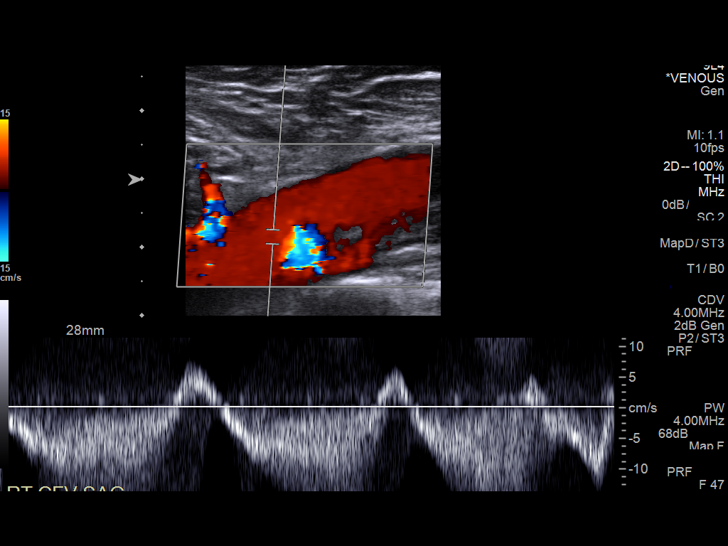
[im 5/28]
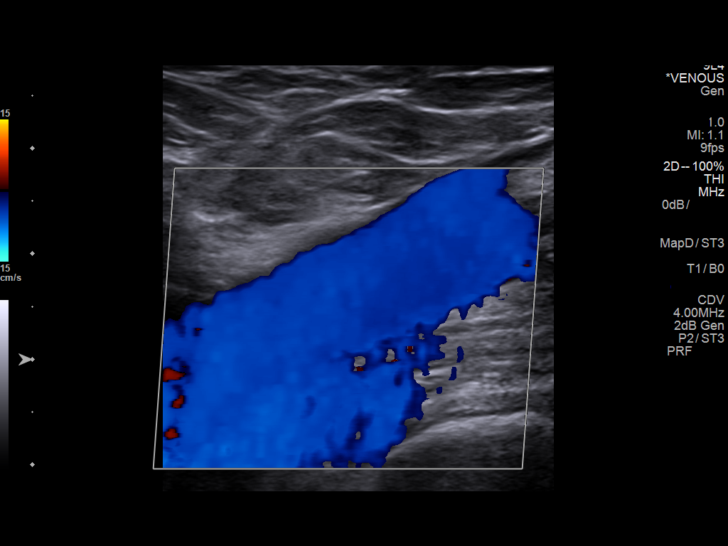
[im 8/28]
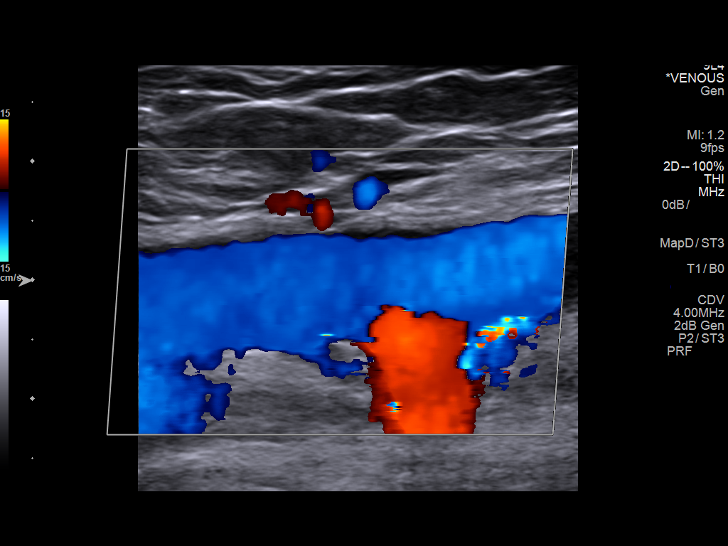
[im 10/28]
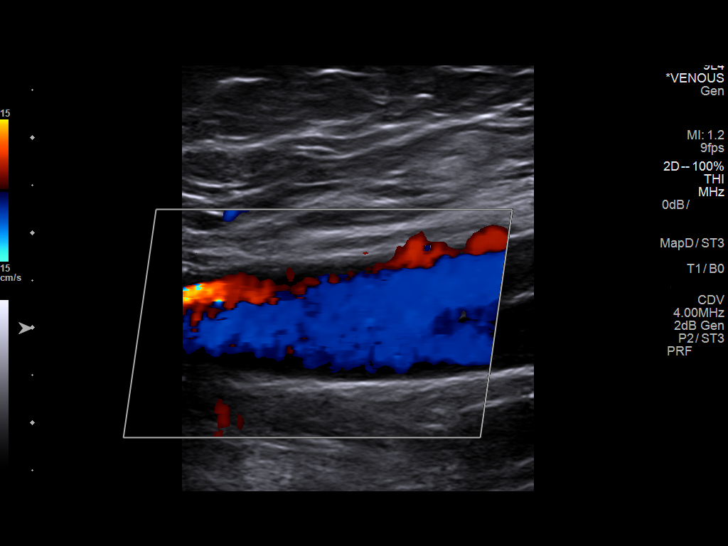
[im 12/28]
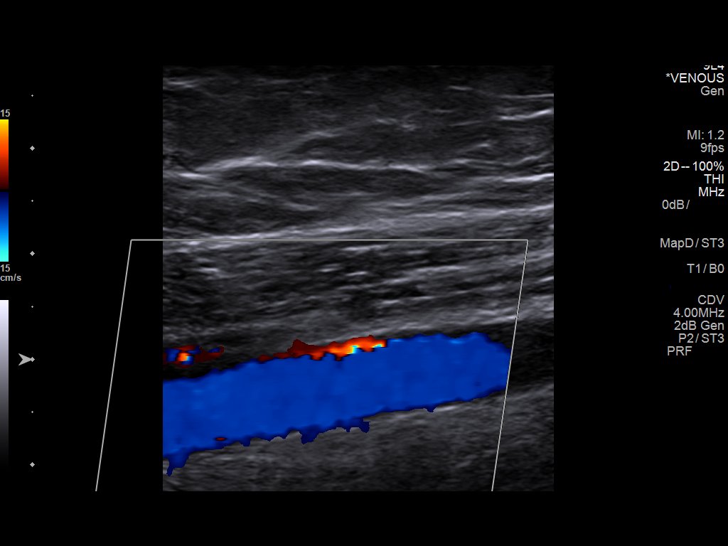
[im 15/28]
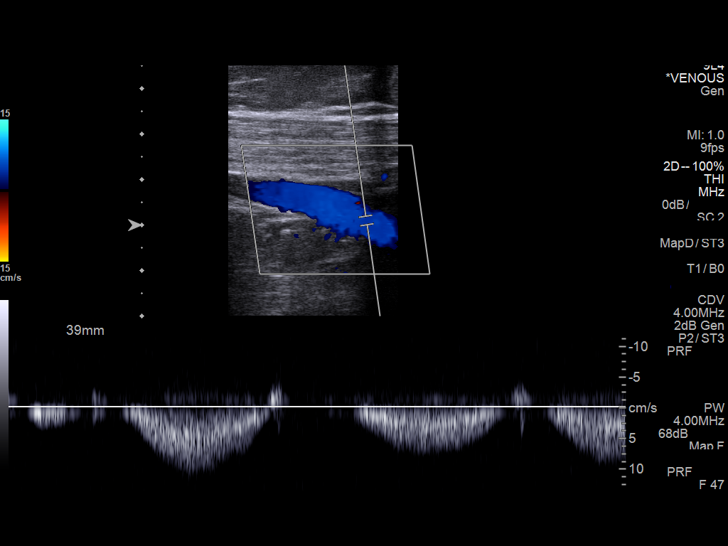
[im 16/28]
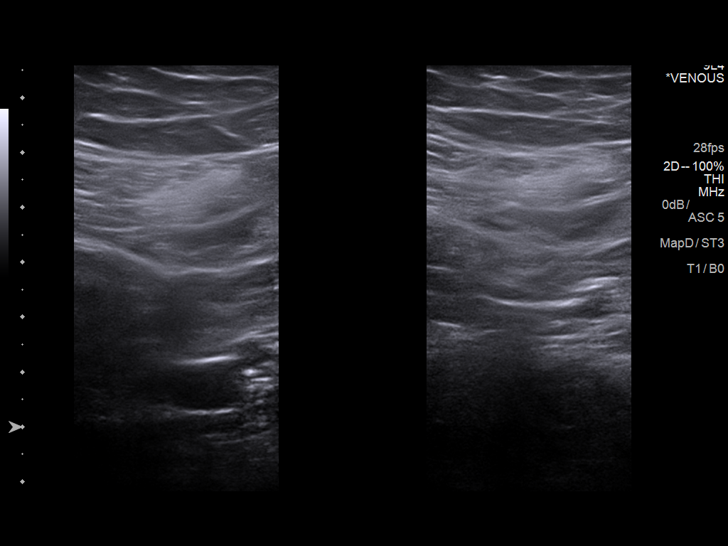
[im 18/28]
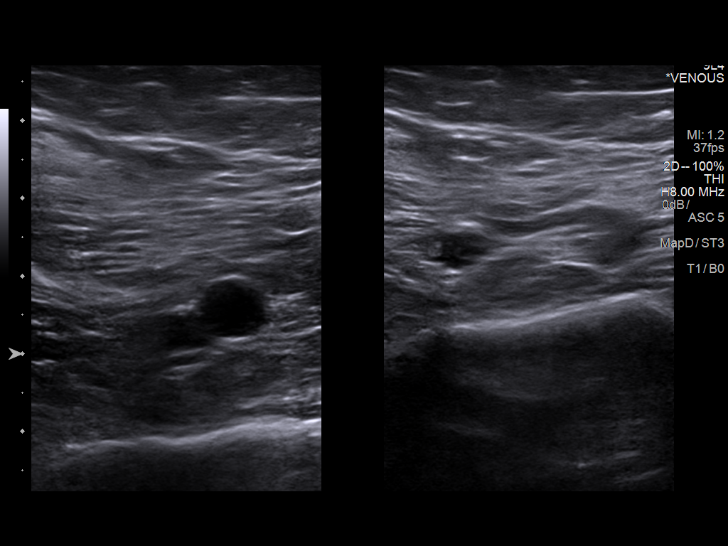
[im 20/28]
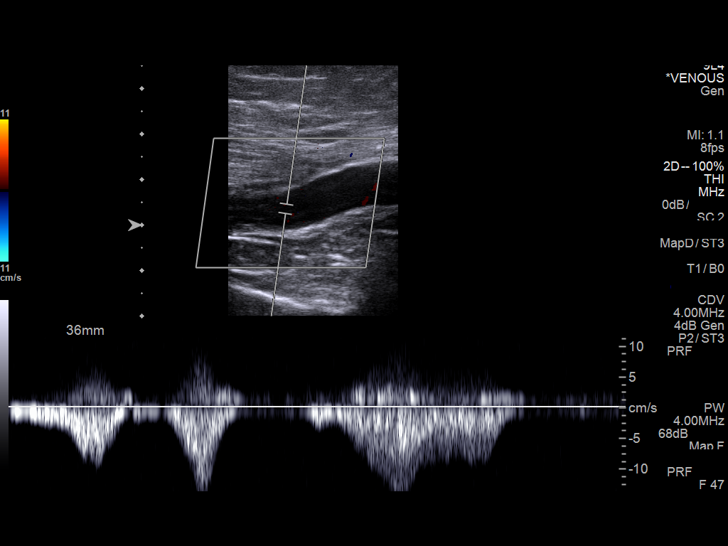
[im 23/28]
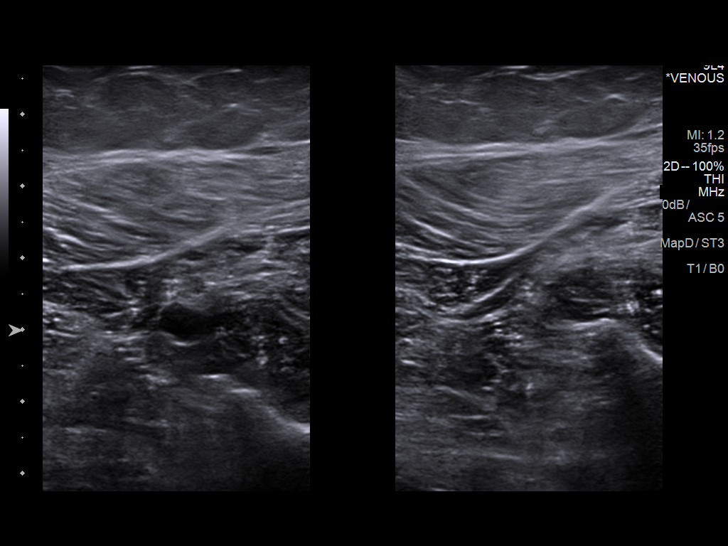
[im 25/28]
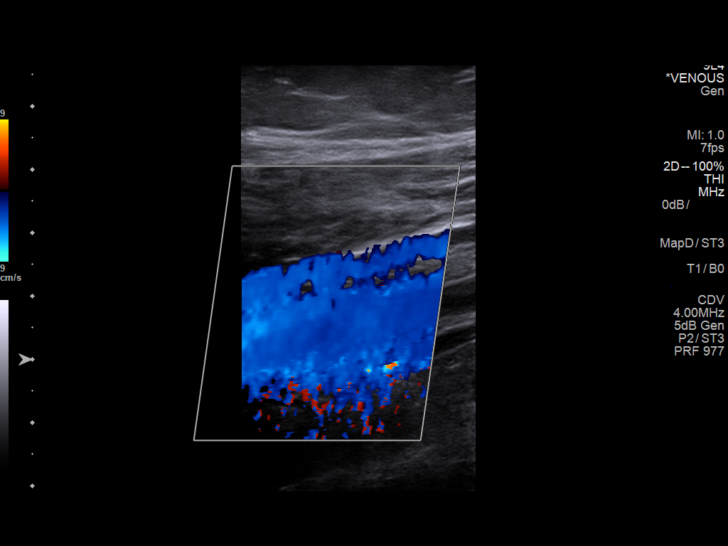
[im 28/28]
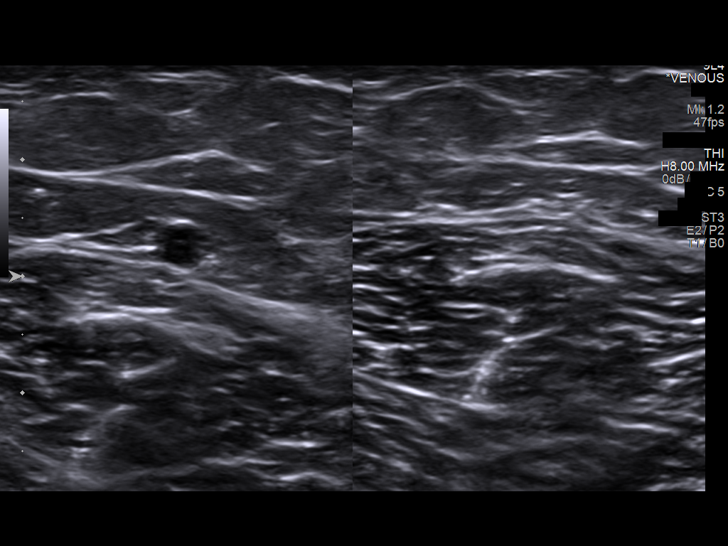

[13 of 24 positions shown; findings below may reference images not displayed]

FINDINGS: Contralateral Common Femoral Vein: Respiratory phasicity is normal
and symmetric with the symptomatic side. No evidence of thrombus.
Normal compressibility.

Common Femoral Vein: No evidence of thrombus. Normal
compressibility, respiratory phasicity and response to augmentation.

Saphenofemoral Junction: No evidence of thrombus. Normal
compressibility and flow on color Doppler imaging.

Profunda Femoral Vein: No evidence of thrombus. Normal
compressibility and flow on color Doppler imaging.

Femoral Vein: No evidence of thrombus. Normal compressibility,
respiratory phasicity and response to augmentation.

Popliteal Vein: No evidence of thrombus. Normal compressibility,
respiratory phasicity and response to augmentation.

Calf Veins: No evidence of thrombus. Normal compressibility and flow
on color Doppler imaging.

Superficial Great Saphenous Vein: No evidence of thrombus. Normal
compressibility and flow on color Doppler imaging.

Venous Reflux:  None.

Other Findings:  None.
IMPRESSION: No evidence of DVT within the left lower extremity.

By: Boudjemaa Fleura M.D.

## 2020-02-12 ENCOUNTER — Encounter (HOSPITAL_BASED_OUTPATIENT_CLINIC_OR_DEPARTMENT_OTHER): Payer: Self-pay | Admitting: Emergency Medicine

## 2020-02-12 ENCOUNTER — Other Ambulatory Visit: Payer: Self-pay

## 2020-02-12 ENCOUNTER — Emergency Department (HOSPITAL_BASED_OUTPATIENT_CLINIC_OR_DEPARTMENT_OTHER)
Admission: EM | Admit: 2020-02-12 | Discharge: 2020-02-12 | Disposition: A | Discharge status: Home / Self Care | Payer: Medicaid Other | Attending: Emergency Medicine | Admitting: Emergency Medicine

## 2020-02-12 DIAGNOSIS — U071 COVID-19: Secondary | ICD-10-CM | POA: Diagnosis present

## 2020-02-12 DIAGNOSIS — Z5321 Procedure and treatment not carried out due to patient leaving prior to being seen by health care provider: Secondary | ICD-10-CM | POA: Insufficient documentation

## 2020-02-12 NOTE — ED Triage Notes (Signed)
Pt states she tested positive for covid earlier this week at [facility]  Pt states tonight she woke up out of her sleep and could not catch her breath so she decided to come here and get checked out

## 2023-10-09 ENCOUNTER — Other Ambulatory Visit: Payer: Self-pay

## 2023-10-09 ENCOUNTER — Emergency Department (HOSPITAL_BASED_OUTPATIENT_CLINIC_OR_DEPARTMENT_OTHER)
Admission: EM | Admit: 2023-10-09 | Discharge: 2023-10-09 | Disposition: A | Discharge status: Home / Self Care | Attending: Emergency Medicine | Admitting: Emergency Medicine

## 2023-10-09 ENCOUNTER — Encounter (HOSPITAL_BASED_OUTPATIENT_CLINIC_OR_DEPARTMENT_OTHER): Payer: Self-pay | Admitting: Emergency Medicine

## 2023-10-09 ENCOUNTER — Other Ambulatory Visit (HOSPITAL_BASED_OUTPATIENT_CLINIC_OR_DEPARTMENT_OTHER): Payer: Self-pay

## 2023-10-09 DIAGNOSIS — X501XXA Overexertion from prolonged static or awkward postures, initial encounter: Secondary | ICD-10-CM | POA: Insufficient documentation

## 2023-10-09 DIAGNOSIS — S4991XA Unspecified injury of right shoulder and upper arm, initial encounter: Secondary | ICD-10-CM | POA: Diagnosis present

## 2023-10-09 DIAGNOSIS — S46911A Strain of unspecified muscle, fascia and tendon at shoulder and upper arm level, right arm, initial encounter: Secondary | ICD-10-CM | POA: Insufficient documentation

## 2023-10-09 DIAGNOSIS — Y990 Civilian activity done for income or pay: Secondary | ICD-10-CM | POA: Insufficient documentation

## 2023-10-09 DIAGNOSIS — M7989 Other specified soft tissue disorders: Secondary | ICD-10-CM | POA: Diagnosis not present

## 2023-10-09 DIAGNOSIS — M79609 Pain in unspecified limb: Secondary | ICD-10-CM

## 2023-10-09 DIAGNOSIS — R2231 Localized swelling, mass and lump, right upper limb: Secondary | ICD-10-CM

## 2023-10-09 DIAGNOSIS — S4491XA Injury of unspecified nerve at shoulder and upper arm level, right arm, initial encounter: Secondary | ICD-10-CM | POA: Diagnosis not present

## 2023-10-09 MED ORDER — METHOCARBAMOL 500 MG PO TABS
500.0000 mg | ORAL_TABLET | Freq: Three times a day (TID) | ORAL | 0 refills | Status: AC | PRN
  Filled 2023-10-09: qty 20, 7d supply, fill #0

## 2023-10-09 NOTE — ED Triage Notes (Signed)
 Pt was working with a drill at work on 4/29 when it kicked back; c/o pain and tingling from LT side neck, down LUE and c/o tingling sensation; LT hand swelling noted; was seen at [facility] on 4/29; had naproxen  at 6a and Goody powder at 10a

## 2023-10-09 NOTE — ED Notes (Signed)
 Pt. Skin warm and dry in the R hand and arm with positive radial pulse and good cap refill

## 2023-10-09 NOTE — ED Provider Notes (Signed)
 [facility] Provider Note   CSN: [id number] Arrival date & time: 10/09/23  1457     History  Chief Complaint  Patient presents with   Tingling    Joy Wright is a 43 y.o. female.  HPI Patient was using an impact wrench that caught abruptly and ended up kicking back and twisting her right wrist arm and shoulder 4\29.  Patient was seen at urgent care on that day and started on naproxen  twice daily.  Patient reports that she has been doing light duty but her right hand is now starting to swell, she is getting tingling sensations over the 3rd, 2nd and 1st digits and part of the forearm.  She reports she is also experiencing more pain at this time into the right lateral neck area and right thoracic back under the scapula.  She reports she is also getting sensation of her hand getting cold.    Home Medications Prior to Admission medications   Medication Sig Start Date End Date Taking? Authorizing Provider  methocarbamol  (ROBAXIN ) 500 MG tablet Take 1 tablet (500 mg total) by mouth every 8 (eight) hours as needed for muscle spasms. 10/09/23  Yes Hedi Barkan, Bufford Carne, MD  Cyanocobalamin (VITAMIN B-12 CR PO) Take by mouth.    [provider]  Methocarbamol  (ROBAXIN  PO) Take by mouth.    [provider]  naproxen  (NAPROSYN ) 500 MG tablet Take 1 tablet (500 mg total) by mouth 2 (two) times daily. 09/29/14   Almond Army, MD      Allergies    Patient has no known allergies.    Review of Systems   Review of Systems  Physical Exam Updated Vital Signs BP (!) 145/105 (BP Location: Left Arm)   Pulse 84   Temp 98.7 F (37.1 C) (Oral)   Resp 18   Ht 5\' 1"  (1.549 m)   Wt 79.8 kg   SpO2 99%   BMI 33.24 kg/m  Physical Exam Constitutional:      Comments: Alert nontoxic well-nourished well-developed.  No respiratory distress.  HENT:     Mouth/Throat:     Pharynx: Oropharynx is clear.  Eyes:     Extraocular Movements:  Extraocular movements intact.  Neck:     Comments: Patient has paraspinous muscle body pain to palpation on the right side.  This extends down along the thoracic spine and subscapularis area.  No visible deformities.  Neck is supple.  No visible soft tissue swelling. Cardiovascular:     Rate and Rhythm: Normal rate and regular rhythm.     Heart sounds: Normal heart sounds.  Pulmonary:     Effort: Pulmonary effort is normal.     Breath sounds: Normal breath sounds.  Musculoskeletal:     Comments: Patient has mild appreciable swelling of the dorsum of the right hand.  No appreciable visible swelling of the wrist elbow or shoulder.  Hand is warm and dry.  Radial pulses 1+.  Patient does have grip strength however decreased which appears mostly secondary to pain.  She can move all the fingers.  Patient is able to range the shoulder independently to about 80 degrees.  Pain above 80 degrees.  Normal range of motion at the elbow.  No discoloration of the hand.  Warm with brisk cap refill.  Skin:    General: Skin is warm and dry.  Neurological:     General: No focal deficit present.     Mental Status: She is oriented to person,  place, and time.     Coordination: Coordination normal.  Psychiatric:        Mood and Affect: Mood normal.     ED Results / Procedures / Treatments   Labs (all labs ordered are listed, but only abnormal results are displayed) Labs Reviewed - No data to display  EKG None  Radiology No results found.  Procedures Procedures    Medications Ordered in ED Medications - No data to display  ED Course/ Medical Decision Making/ A&P                                 Medical Decision Making  Patient presents as outlined with an injury 2 days ago that encompasses both strain and likely neuropraxia.  Strain appears to be at multiple levels including paraspinous and subscapularis muscles, shoulder, elbow and wrist.  No deformities.  I do not suspect bony fracture.   Patient may however have both combination of ligamentous and muscle strains.  With paresthesias I suspect a neuropraxia.  At this time I do not think imaging in the emergency department will be helpful.  Patient will need continued light duty and no use of the right arm at work.  I have recommended follow-up with orthopedics for further evaluation.  Patient voices understanding.        Final Clinical Impression(s) / ED Diagnoses Final diagnoses:  Neurapraxia of right upper extremity, initial encounter  Muscle strain of upper extremity, right, initial encounter  Localized swelling on right hand  Paresthesia and pain of right extremity    Rx / DC Orders ED Discharge Orders          Ordered    methocarbamol  (ROBAXIN ) 500 MG tablet  Every 8 hours PRN        10/09/23 1525              Wynetta Heckle, MD 10/09/23 1543

## 2023-10-09 NOTE — Discharge Instructions (Addendum)
 1.  You had an injury that has likely stretched some nerves in your arm as well as pulled muscles and possibly tendons or ligaments.  Multiple locations of the arm appear to be involved including the hand forearm and shoulder and upper back.  You need to follow-up with an orthopedic specialist to make sure that your injuries are healing and you get appropriate treatment to get back to normal activities. 2.  You have been given contact information for [facility].  Dr. Jackee Marus is on-call today but you may be referred to another practitioner in the practice for your particular injury.  Call the next business day to schedule your follow-up appointment as soon as possible. 3.  You will need light duty with work until you have been evaluated with orthopedics and a plan for treatment is established.  At this time you cannot do any heavy lifting or repetitive tasks with your right arm. 4.  Continue to take your naproxen  twice daily for pain.  You have also been given prescription for Robaxin  to take as a muscle relaxer.  Try to ice any swollen or painful areas. 5.  If you develop weakness or dysfunction of the arm or hand, return to the emergency department immediately for recheck.

## 2023-10-09 NOTE — ED Notes (Signed)
 Pt. Has been assessed by [facility] and will be discharge accordingly.  Pt. Has pain in her R arm and Neck area.  Pt. Sent here by her [employer] Rep.  Pt. Has tingling sensation in her R hand and R arm area.
